# Patient Record
Sex: Male | Born: 1980 | Race: Black or African American | Hispanic: No | Marital: Single | Smoking: Never smoker
Health system: Southern US, Community
[De-identification: ages and names within clinical notes are randomized; demographics above are authoritative.]

## PROBLEM LIST (undated history)

## (undated) DIAGNOSIS — Z8241 Family history of sudden cardiac death: Secondary | ICD-10-CM

## (undated) DIAGNOSIS — F988 Other specified behavioral and emotional disorders with onset usually occurring in childhood and adolescence: Secondary | ICD-10-CM

## (undated) DIAGNOSIS — R002 Palpitations: Secondary | ICD-10-CM

## (undated) HISTORY — DX: Palpitations: R00.2

## (undated) HISTORY — DX: Family history of sudden cardiac death: Z82.41

## (undated) HISTORY — DX: Other specified behavioral and emotional disorders with onset usually occurring in childhood and adolescence: F98.8

---

## 2000-07-26 ENCOUNTER — Emergency Department (HOSPITAL_COMMUNITY): Admission: EM | Admit: 2000-07-26 | Discharge: 2000-07-26 | Payer: Self-pay | Admitting: Emergency Medicine

## 2002-06-10 ENCOUNTER — Emergency Department (HOSPITAL_COMMUNITY): Admission: EM | Admit: 2002-06-10 | Discharge: 2002-06-10 | Payer: Self-pay | Admitting: *Deleted

## 2002-06-10 ENCOUNTER — Encounter: Payer: Self-pay | Admitting: *Deleted

## 2004-12-21 ENCOUNTER — Ambulatory Visit: Payer: Self-pay | Admitting: Family Medicine

## 2005-02-06 ENCOUNTER — Ambulatory Visit: Payer: Self-pay | Admitting: Family Medicine

## 2005-12-13 ENCOUNTER — Ambulatory Visit: Payer: Self-pay | Admitting: Family Medicine

## 2006-04-11 ENCOUNTER — Ambulatory Visit: Payer: Self-pay | Admitting: Family Medicine

## 2006-06-05 ENCOUNTER — Emergency Department (HOSPITAL_COMMUNITY): Admission: EM | Admit: 2006-06-05 | Discharge: 2006-06-05 | Payer: Self-pay | Admitting: Emergency Medicine

## 2007-05-26 DIAGNOSIS — L708 Other acne: Secondary | ICD-10-CM

## 2007-05-26 DIAGNOSIS — T7840XA Allergy, unspecified, initial encounter: Secondary | ICD-10-CM | POA: Insufficient documentation

## 2007-05-27 ENCOUNTER — Ambulatory Visit: Payer: Self-pay | Admitting: Family Medicine

## 2007-05-27 DIAGNOSIS — A630 Anogenital (venereal) warts: Secondary | ICD-10-CM | POA: Insufficient documentation

## 2007-06-02 ENCOUNTER — Encounter: Payer: Self-pay | Admitting: Family Medicine

## 2008-08-05 ENCOUNTER — Ambulatory Visit: Payer: Self-pay | Admitting: Family Medicine

## 2010-01-18 ENCOUNTER — Ambulatory Visit: Payer: Self-pay | Admitting: Family Medicine

## 2010-01-18 DIAGNOSIS — D179 Benign lipomatous neoplasm, unspecified: Secondary | ICD-10-CM | POA: Insufficient documentation

## 2010-07-10 NOTE — Assessment & Plan Note (Signed)
Summary: CHECK PLACE ON R ARM/CLE   Vital Signs:  Patient profile:   30 year old male Weight:      189.75 pounds Temp:     97.9 degrees F oral Pulse rate:   84 / minute Pulse rhythm:   regular BP sitting:   110 / 76  (left arm) Cuff size:   large  Vitals Entered By: Delilah Shan CMA Duncan Dull) (January 18, 2010 10:43 AM) CC: Check place on right arm.   History of Present Illness: On R arm, near mid triceps.  Noticed about 65months-1 year ago.  Single lesion.  tender to palpation.  No change in size recently.  No drainage.  No erythema.      Allergies: No Known Drug Allergies  Review of Systems       See HPI.  Otherwise negative.    Physical Exam  General:  GEN: nad, alert and oriented HEENT: mucous membranes moist NECK: supple w/o LA CV: rrr.  no murmur PULM: ctab, no inc wob SKIN: no acute rash, 6mm lesion that feels like a typical lipoma on R posterior arm.  No erythema.  No other masses, no LA in axilla.    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  D/w patient.  No intervention o/w.  he understood.   Patient Instructions: 1)  Call us back if the spot is getting a lot bigger or is more painful.  I think it is a lipoma.    Prior Medications (reviewed today): None Current Allergies (reviewed today): No known allergies

## 2011-06-06 ENCOUNTER — Encounter: Payer: Self-pay | Admitting: Family Medicine

## 2011-06-06 ENCOUNTER — Ambulatory Visit (INDEPENDENT_AMBULATORY_CARE_PROVIDER_SITE_OTHER): Payer: 59 | Admitting: Family Medicine

## 2011-06-06 VITALS — BP 120/74 | HR 100 | Temp 99.9°F | Resp 82 | Wt 188.5 lb

## 2011-06-06 DIAGNOSIS — R6889 Other general symptoms and signs: Secondary | ICD-10-CM

## 2011-06-06 MED ORDER — AZITHROMYCIN 250 MG PO TABS: ORAL_TABLET | ORAL | Status: AC

## 2011-06-06 MED ORDER — HYDROCOD POLST-CHLORPHEN POLST 10-8 MG/5ML PO LQCR: 5.0000 mL | Freq: Two times a day (BID) | ORAL | Status: DC | PRN

## 2011-06-06 NOTE — Progress Notes (Signed)
SUBJECTIVE:  Christopher West is a 30 y.o. male who complains of coryza, congestion, myalgias, headache and fever for 4 days. He denies a history of anorexia, chest pain and shortness of breath and denies a history of asthma. Patient denies smoke cigarettes. Did not get flu shot this year.  No known sick contacts.  Patient Active Problem List  Diagnoses  . VENEREAL WART  . LIPOMA  . ACNE VULGARIS  . ALLERGY   No past medical history on file. No past surgical history on file. History  Substance Use Topics  . Smoking status: Never Smoker   . Smokeless tobacco: Not on file  . Alcohol Use: Not on file   No family history on file. Allergies not on file No current outpatient prescriptions on file prior to visit.   The PMH, PSH, Social History, Family History, Medications, and allergies have been reviewed in [facility], and have been updated if relevant.    OBJECTIVE: BP 120/74  Pulse 100  Temp(Src) 99.9 F (37.7 C) (Oral)  Resp 82  Wt 188 lb 8 oz (85.503 kg)  He appears well, vital signs are as noted. Ears normal.  Throat and pharynx normal.  Neck supple. No adenopathy in the neck. Nose is congested. Sinuses non tender. Ronchi on right side, otherwise clear.   ASSESSMENT:  Influenza- ? Possible post flu PNA  PLAN: Zpack, tussionex as needed for cough. Symptomatic therapy suggested: push fluids, rest and return office visit prn if symptoms persist or worsen.  Call or return to clinic prn if these symptoms worsen or fail to improve as anticipated.

## 2011-06-06 NOTE — Patient Instructions (Signed)
Take antibiotic as directed.  Drink lots of fluids.  Treat sympotmatically with Mucinex, nasal saline irrigation, and Tylenol/Ibuprofen. Also try claritin D or zyrtec D over the counter- two times a day as needed ( have to sign for them at pharmacy). Cough suppressant at night. Call if not improving as expected in 5-7 days.

## 2012-03-09 ENCOUNTER — Ambulatory Visit (INDEPENDENT_AMBULATORY_CARE_PROVIDER_SITE_OTHER): Payer: 59 | Admitting: Family Medicine

## 2012-03-09 ENCOUNTER — Encounter: Payer: Self-pay | Admitting: Family Medicine

## 2012-03-09 VITALS — BP 120/60 | HR 76 | Temp 98.5°F | Wt 194.0 lb

## 2012-03-09 DIAGNOSIS — J329 Chronic sinusitis, unspecified: Secondary | ICD-10-CM

## 2012-03-09 MED ORDER — AMOXICILLIN-POT CLAVULANATE 875-125 MG PO TABS: 1.0000 | ORAL_TABLET | Freq: Two times a day (BID) | ORAL | Status: DC

## 2012-03-09 MED ORDER — FLUTICASONE PROPIONATE 50 MCG/ACT NA SUSP: 2.0000 | Freq: Every day | NASAL | Status: DC

## 2012-03-09 NOTE — Patient Instructions (Addendum)
Take Augmentinas directed.  Drink lots of fluids.  Treat sympotmatically with Flonase, Mucinex, nasal saline irrigation, and Tylenol/Ibuprofen. Also try claritin D or zyrtec D over the counter- two times a day as needed ( have to sign for them at pharmacy). You can use warm compresses.   Call if not improving as expected in 5-7 days.

## 2012-03-09 NOTE — Progress Notes (Signed)
SUBJECTIVE:  Christopher West is a 31 y.o. male who complains of coryza, congestion, myalgias, bilateral sinus pain and fever for 8 days. He denies a history of anorexia and chest pain and denies a history of asthma. Patient denies smoke cigarettes.   Patient Active Problem List  Diagnosis  . VENEREAL WART  . LIPOMA  . ACNE VULGARIS  . ALLERGY   No past medical history on file. No past surgical history on file. History  Substance Use Topics  . Smoking status: Never Smoker   . Smokeless tobacco: Not on file  . Alcohol Use: Not on file   No family history on file. No Known Allergies No current outpatient prescriptions on file prior to visit.   The PMH, PSH, Social History, Family History, Medications, and allergies have been reviewed in [facility], and have been updated if relevant.  OBJECTIVE: BP 120/60  Pulse 76  Temp 98.5 F (36.9 C)  Wt 194 lb (87.998 kg)  He appears well, vital signs are as noted. Ears normal.  Throat and pharynx normal.  Neck supple. No adenopathy in the neck. Nose is congested. Sinuses non tender. The chest is clear, without wheezes or rales.  ASSESSMENT:  sinusitis  PLAN: Given duration and progression of symptoms, will treat for bacterial sinusitis. Symptomatic therapy suggested: push fluids, rest and return office visit prn if symptoms persist or worsen.  Call or return to clinic prn if these symptoms worsen or fail to improve as anticipated.

## 2013-04-30 ENCOUNTER — Encounter: Payer: Self-pay | Admitting: Family Medicine

## 2013-04-30 ENCOUNTER — Ambulatory Visit (INDEPENDENT_AMBULATORY_CARE_PROVIDER_SITE_OTHER): Payer: 59 | Admitting: Family Medicine

## 2013-04-30 VITALS — BP 116/78 | HR 64 | Temp 97.9°F | Wt 194.2 lb

## 2013-04-30 DIAGNOSIS — H00019 Hordeolum externum unspecified eye, unspecified eyelid: Secondary | ICD-10-CM

## 2013-04-30 DIAGNOSIS — R2230 Localized swelling, mass and lump, unspecified upper limb: Secondary | ICD-10-CM | POA: Insufficient documentation

## 2013-04-30 DIAGNOSIS — R229 Localized swelling, mass and lump, unspecified: Secondary | ICD-10-CM

## 2013-04-30 DIAGNOSIS — H00013 Hordeolum externum right eye, unspecified eyelid: Secondary | ICD-10-CM

## 2013-04-30 DIAGNOSIS — R2231 Localized swelling, mass and lump, right upper limb: Secondary | ICD-10-CM

## 2013-04-30 MED ORDER — ERYTHROMYCIN 5 MG/GM OP OINT: 1.0000 "application " | TOPICAL_OINTMENT | Freq: Three times a day (TID) | OPHTHALMIC | Status: DC

## 2013-04-30 NOTE — Assessment & Plan Note (Signed)
Discussed - supportive care as per instructions. Handout provided. If not improving, will refer to ophtho - may need I&D

## 2013-04-30 NOTE — Assessment & Plan Note (Signed)
Anticipate epidermal cyst - as not currently infected will just monitor.

## 2013-04-30 NOTE — Addendum Note (Signed)
Addended by: Eustaquio Boyden on: 04/30/2013 08:52 AM   Modules accepted: Orders

## 2013-04-30 NOTE — Progress Notes (Signed)
Pre-visit discussion using our clinic review tool. No additional management support is needed unless otherwise documented below in the visit note.  

## 2013-04-30 NOTE — Progress Notes (Signed)
  Subjective:    Patient ID: Christopher West, male    DOB: [birth year], 32 y.o.   MRN: [id number]  HPI CC: check lump on eye and on hand  Stye on right eye - for last 2.5 weeks.   Has been using warm compresses, eyedrops for stye from [facility].  Also wants right arm lump evaluated - tender to palpation.  Present for last 3-4 years.  Inferior right arm.  Occasionally sore, never drained or red.  No past medical history on file.   Review of Systems Per HPI    Objective:   Physical Exam  Nursing note and vitals reviewed. Constitutional: He appears well-developed and well-nourished. No distress.  Eyes: Conjunctivae and EOM are normal. Pupils are equal, round, and reactive to light. Right eye exhibits hordeolum. Right eye exhibits no discharge. No foreign body present in the right eye. Left eye exhibits no discharge and no hordeolum. No foreign body present in the left eye. No scleral icterus.    Indurated swelling R medial upper eyelid with erythema at palpebral conjunctiva  Musculoskeletal:  Subcutaneous nodule inferior R arm distal to axilla, no fluctuance or erythema. Slightly tender to deep palpation.  Skin: Skin is warm and dry. No rash noted.       Assessment & Plan:

## 2013-04-30 NOTE — Patient Instructions (Signed)
I think you have a cyst under right arm - would just continue to watch, let us know if enlarging or becoming warm or red - this would be concern for infection I think you have stye of R eye - treat with continued warm compresses, massaging, and erythromycin ointment to eye.  If not improving in next 1 week, let us know for referral to eye doctor.

## 2013-05-25 ENCOUNTER — Telehealth: Payer: Self-pay | Admitting: Family Medicine

## 2013-05-25 DIAGNOSIS — H00019 Hordeolum externum unspecified eye, unspecified eyelid: Secondary | ICD-10-CM

## 2013-05-25 NOTE — Telephone Encounter (Signed)
Referral placed.

## 2013-05-25 NOTE — Telephone Encounter (Signed)
Pt was in to see you on 04/30/2013 w/a problem eye.  He says you told him if it was no better w/in a week to call you and get a referral to an eye dr.  [name] says it's no better so he needs the referral. Can you place the referral? Thank you.

## 2014-11-28 ENCOUNTER — Encounter: Payer: 59 | Admitting: Family Medicine

## 2014-12-04 ENCOUNTER — Emergency Department: Payer: 59

## 2014-12-04 ENCOUNTER — Encounter: Payer: Self-pay | Admitting: Emergency Medicine

## 2014-12-04 ENCOUNTER — Observation Stay
Admission: EM | Admit: 2014-12-04 | Discharge: 2014-12-05 | Disposition: A | Discharge status: Home / Self Care | Payer: 59 | Attending: Internal Medicine | Admitting: Internal Medicine

## 2014-12-04 DIAGNOSIS — R55 Syncope and collapse: Secondary | ICD-10-CM | POA: Diagnosis not present

## 2014-12-04 DIAGNOSIS — Z79899 Other long term (current) drug therapy: Secondary | ICD-10-CM | POA: Diagnosis not present

## 2014-12-04 DIAGNOSIS — R000 Tachycardia, unspecified: Secondary | ICD-10-CM | POA: Insufficient documentation

## 2014-12-04 DIAGNOSIS — Z888 Allergy status to other drugs, medicaments and biological substances status: Secondary | ICD-10-CM | POA: Diagnosis not present

## 2014-12-04 DIAGNOSIS — E876 Hypokalemia: Secondary | ICD-10-CM | POA: Insufficient documentation

## 2014-12-04 DIAGNOSIS — R0789 Other chest pain: Principal | ICD-10-CM | POA: Insufficient documentation

## 2014-12-04 DIAGNOSIS — F17200 Nicotine dependence, unspecified, uncomplicated: Secondary | ICD-10-CM | POA: Diagnosis not present

## 2014-12-04 DIAGNOSIS — A630 Anogenital (venereal) warts: Secondary | ICD-10-CM | POA: Insufficient documentation

## 2014-12-04 DIAGNOSIS — N62 Hypertrophy of breast: Secondary | ICD-10-CM | POA: Insufficient documentation

## 2014-12-04 DIAGNOSIS — Z8241 Family history of sudden cardiac death: Secondary | ICD-10-CM | POA: Diagnosis not present

## 2014-12-04 DIAGNOSIS — R079 Chest pain, unspecified: Secondary | ICD-10-CM | POA: Diagnosis present

## 2014-12-04 LAB — TROPONIN I
TROPONIN I: 0.04 ng/mL — AB (ref <0.031)
Troponin I: 0.04 ng/mL — ABNORMAL HIGH (ref <0.031)
Troponin I: 0.04 ng/mL — ABNORMAL HIGH (ref <0.031)

## 2014-12-04 LAB — CBC WITH DIFFERENTIAL/PLATELET
Basophils Absolute: 0 10*3/uL (ref 0–0.1)
Basophils Relative: 0 %
Eosinophils Absolute: 0 10*3/uL (ref 0–0.7)
Eosinophils Relative: 0 %
HCT: 42.3 % (ref 40.0–52.0)
Hemoglobin: 13.8 g/dL (ref 13.0–18.0)
LYMPHS ABS: 0.5 10*3/uL — AB (ref 1.0–3.6)
LYMPHS PCT: 5 %
MCH: 28.6 pg (ref 26.0–34.0)
MCHC: 32.7 g/dL (ref 32.0–36.0)
MCV: 87.6 fL (ref 80.0–100.0)
MONOS PCT: 4 %
Monocytes Absolute: 0.4 10*3/uL (ref 0.2–1.0)
Neutro Abs: 9.8 10*3/uL — ABNORMAL HIGH (ref 1.4–6.5)
Neutrophils Relative %: 91 %
Platelets: 306 10*3/uL (ref 150–440)
RBC: 4.83 MIL/uL (ref 4.40–5.90)
RDW: 13.2 % (ref 11.5–14.5)
WBC: 10.8 10*3/uL — ABNORMAL HIGH (ref 3.8–10.6)

## 2014-12-04 LAB — BASIC METABOLIC PANEL
ANION GAP: 13 (ref 5–15)
BUN: 13 mg/dL (ref 6–20)
CALCIUM: 9.3 mg/dL (ref 8.9–10.3)
CO2: 25 mmol/L (ref 22–32)
Chloride: 100 mmol/L — ABNORMAL LOW (ref 101–111)
Creatinine, Ser: 0.88 mg/dL (ref 0.61–1.24)
GFR calc Af Amer: >60 mL/min (ref >60)
GLUCOSE: 129 mg/dL — AB (ref 65–99)
POTASSIUM: 3 mmol/L — AB (ref 3.5–5.1)
Sodium: 138 mmol/L (ref 135–145)

## 2014-12-04 LAB — MAGNESIUM: Magnesium: 2 mg/dL (ref 1.7–2.4)

## 2014-12-04 MED ORDER — ASPIRIN 81 MG PO CHEW
324.0000 mg | CHEWABLE_TABLET | Freq: Once | ORAL | Status: AC
  Administered 2014-12-04: 324 mg via ORAL

## 2014-12-04 MED ORDER — POTASSIUM CHLORIDE 20 MEQ PO PACK
20.0000 meq | PACK | Freq: Two times a day (BID) | ORAL | Status: DC
  Administered 2014-12-04 – 2014-12-05 (×3): 20 meq via ORAL
  Filled 2014-12-04 (×3): qty 1

## 2014-12-04 MED ORDER — DIPHENHYDRAMINE HCL 25 MG PO CAPS
25.0000 mg | ORAL_CAPSULE | Freq: Every evening | ORAL | Status: DC | PRN
  Administered 2014-12-04: 25 mg via ORAL
  Filled 2014-12-04: qty 1

## 2014-12-04 MED ORDER — ACETAMINOPHEN 325 MG PO TABS: 650.0000 mg | ORAL_TABLET | Freq: Four times a day (QID) | ORAL | Status: DC | PRN

## 2014-12-04 MED ORDER — SODIUM CHLORIDE 0.9 % IJ SOLN
3.0000 mL | Freq: Two times a day (BID) | INTRAMUSCULAR | Status: DC
  Administered 2014-12-05: 3 mL via INTRAVENOUS

## 2014-12-04 MED ORDER — LORAZEPAM 2 MG/ML IJ SOLN
INTRAMUSCULAR | Status: AC
  Administered 2014-12-04: 1 mg via INTRAVENOUS
  Filled 2014-12-04: qty 1

## 2014-12-04 MED ORDER — ASPIRIN 81 MG PO CHEW
81.0000 mg | CHEWABLE_TABLET | Freq: Every day | ORAL | Status: DC
  Administered 2014-12-05: 81 mg via ORAL
  Filled 2014-12-04: qty 1

## 2014-12-04 MED ORDER — SODIUM CHLORIDE 0.9 % IV SOLN: 250.0000 mL | INTRAVENOUS | Status: DC | PRN

## 2014-12-04 MED ORDER — ASPIRIN 81 MG PO CHEW
CHEWABLE_TABLET | ORAL | Status: AC
Start: 2014-12-04 — End: 2014-12-05
  Filled 2014-12-04: qty 4

## 2014-12-04 MED ORDER — LORAZEPAM 2 MG/ML IJ SOLN
1.0000 mg | Freq: Once | INTRAMUSCULAR | Status: AC
  Administered 2014-12-04: 1 mg via INTRAVENOUS

## 2014-12-04 MED ORDER — SODIUM CHLORIDE 0.9 % IJ SOLN: 3.0000 mL | INTRAMUSCULAR | Status: DC | PRN

## 2014-12-04 MED ORDER — METOCLOPRAMIDE HCL 5 MG/ML IJ SOLN
10.0000 mg | Freq: Once | INTRAMUSCULAR | Status: AC
  Administered 2014-12-04: 10 mg via INTRAVENOUS

## 2014-12-04 MED ORDER — SODIUM CHLORIDE 0.9 % IV BOLUS (SEPSIS)
1000.0000 mL | Freq: Once | INTRAVENOUS | Status: AC
  Administered 2014-12-04: 1000 mL via INTRAVENOUS

## 2014-12-04 MED ORDER — IOHEXOL 350 MG/ML SOLN
100.0000 mL | Freq: Once | INTRAVENOUS | Status: AC | PRN
  Administered 2014-12-04: 100 mL via INTRAVENOUS

## 2014-12-04 MED ORDER — SODIUM CHLORIDE 0.9 % IJ SOLN
3.0000 mL | Freq: Two times a day (BID) | INTRAMUSCULAR | Status: DC
  Administered 2014-12-04 – 2014-12-05 (×3): 3 mL via INTRAVENOUS

## 2014-12-04 MED ORDER — METOCLOPRAMIDE HCL 5 MG/ML IJ SOLN
INTRAMUSCULAR | Status: AC
  Administered 2014-12-04: 10 mg via INTRAVENOUS
  Filled 2014-12-04: qty 2

## 2014-12-04 MED ORDER — ACETAMINOPHEN 650 MG RE SUPP: 650.0000 mg | Freq: Four times a day (QID) | RECTAL | Status: DC | PRN

## 2014-12-04 MED ORDER — ALUM & MAG HYDROXIDE-SIMETH 200-200-20 MG/5ML PO SUSP
15.0000 mL | ORAL | Status: DC | PRN
  Administered 2014-12-04: 15 mL via ORAL
  Filled 2014-12-04: qty 30
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
## 2014-12-04 NOTE — Progress Notes (Signed)
Pt requesting melatonin for sleep.  Informed by pharmacy we are no longer allowed to order or administer herbal supplements

## 2014-12-04 NOTE — ED Notes (Signed)
Patient transported to CT 
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
## 2014-12-04 NOTE — Progress Notes (Signed)
Pt in NAD, skin warm and dry, respirations even and unlabored.  VSS, SR per monitor.  Pt denies pain or discomfort at this time.  Mylanta ordered for "heartburn" and benadryl for sleep at pt request since admission.  Wife at the bedside.  Continued monitoring.  Troponins 0.04 x 2, Mg 2.
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
## 2014-12-04 NOTE — ED Notes (Signed)
MD at bedside. 

## 2014-12-05 LAB — BASIC METABOLIC PANEL
Anion gap: 9 (ref 5–15)
BUN: 6 mg/dL (ref 6–20)
CALCIUM: 9 mg/dL (ref 8.9–10.3)
CHLORIDE: 107 mmol/L (ref 101–111)
CO2: 23 mmol/L (ref 22–32)
Creatinine, Ser: 0.87 mg/dL (ref 0.61–1.24)
GFR calc Af Amer: >60 mL/min (ref >60)
GFR calc non Af Amer: >60 mL/min (ref >60)
Glucose, Bld: 123 mg/dL — ABNORMAL HIGH (ref 65–99)
Potassium: 3.4 mmol/L — ABNORMAL LOW (ref 3.5–5.1)
Sodium: 139 mmol/L (ref 135–145)

## 2014-12-05 LAB — TROPONIN I: Troponin I: <0.03 ng/mL (ref <0.031)

## 2014-12-05 NOTE — Progress Notes (Signed)
Christopher West was admitted to the Hospital on 12/04/2014 and Discharged  12/05/2014 and should be excused from work/school   for 12/04/2014 and 12/05/2014 ,he may return to work/school without any restrictions.  Call Bettey Costa MD with questions.  Nemesis Rainwater M.D on 12/05/2014,at 9:09 AM  [place] [facility]  [phone]

## 2014-12-05 NOTE — Progress Notes (Signed)
No chest pain. A & O. Takes meds ok. Ambulating in the hallway. Wife at the bedside. Room air. NSR. IV and tele removed. Discharge instructions given to pt. Pt has no further concerns at this time.

## 2014-12-05 NOTE — Plan of Care (Signed)
Problem: Discharge Progression Outcomes Goal: Other Discharge Outcomes/Goals Outcome: Progressing Pt has denied pain this shift. Ambulated with spouse at side in the hallway without difficulty. Benadryl given for sleep with improvement. Pt give emotional support because he stated " I'm afraid to go to sleep" Reassured patient that he is constantly being monitored. Pt c/o his dislike of the position of his iv, so iv changed per patient request.

## 2014-12-05 NOTE — Discharge Summary (Signed)
[facility] [place] NAME: Christopher West    MR#:  [id number]  DATE OF BIRTH:  [birth year]  DATE OF ADMISSION:  12/04/2014 ADMITTING PHYSICIAN: Baxter Hire, MD  DATE OF DISCHARGE: 12/05/2014 10:20 AM  PRIMARY CARE PHYSICIAN: Loura Pardon, MD    ADMISSION DIAGNOSIS:  Chest pain, unspecified chest pain type [R07.9]  DISCHARGE DIAGNOSIS:  Active Problems:   Chest pain   SECONDARY DIAGNOSIS:  History reviewed. No pertinent past medical history.  HOSPITAL COURSE:  This is a very pleasant 34 year old male with no past medical history who presented with atypical chest pain. For further details please further H&P.  1. 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 His troponins were negative 3 And telemetry monitoring was also essentially normal. Patient had no chest pain while in the hospital. Patient may resume his normal activities.   DISCHARGE CONDITIONS AND DIET:  Patient is to be discharged home in stable condition on a regular diet  CONSULTS OBTAINED:    none  DRUG ALLERGIES:   Allergies  Allergen Reactions  . Ativan [Lorazepam] Anxiety    DISCHARGE MEDICATIONS:   Discharge Medication List as of 12/05/2014 10:03 AM    CONTINUE these medications which have NOT CHANGED   Details  fluticasone (FLONASE) 50 MCG/ACT nasal spray Place 2 sprays into the nose daily., Starting 03/09/2012, Until Discontinued, Normal              Today   CHIEF COMPLAINT:  Patient has no complaints this morning. Patient denies chest pressure or shortness of breath.   VITAL SIGNS:  Blood pressure 128/60, pulse 84, temperature 98.9 F (37.2 C), temperature source Oral, resp. rate 18, height 5\' 7"  (1.702 m), weight 86.546 kg (190 lb 12.8 oz), SpO2 99 %.   REVIEW OF SYSTEMS:  Review of Systems  Constitutional: Negative for fever, chills and malaise/fatigue.  HENT: Negative for  sore throat.   Eyes: Negative for blurred vision.  Respiratory: Negative for cough, hemoptysis, shortness of breath and wheezing.   Cardiovascular: Negative for chest pain, palpitations and leg swelling.  Gastrointestinal: Negative for nausea, vomiting, abdominal pain, diarrhea and blood in stool.  Genitourinary: Negative for dysuria.  Musculoskeletal: Negative for back pain.  Neurological: Negative for dizziness, tremors and headaches.  Endo/Heme/Allergies: Does not bruise/bleed easily.     PHYSICAL EXAMINATION:  GENERAL:  34 y.o.-year-old patient lying in the bed with no acute distress.  NECK:  Supple, no jugular venous distention. No thyroid enlargement, no tenderness.  LUNGS: Normal breath sounds bilaterally, no wheezing, rales,rhonchi  No use of accessory muscles of respiration.  CARDIOVASCULAR: S1, S2 normal. No murmurs, rubs, or gallops.  ABDOMEN: Soft, non-tender, non-distended. Bowel sounds present. No organomegaly or mass.  EXTREMITIES: No pedal edema, cyanosis, or clubbing.  PSYCHIATRIC: The patient is alert and oriented x 3.  SKIN: No obvious rash, lesion, or ulcer.   DATA REVIEW:   CBC  Recent Labs Lab 12/04/14 0956  WBC 10.8*  HGB 13.8  HCT 42.3  PLT 306    Chemistries   Recent Labs Lab 12/04/14 1432 12/05/14 0106  NA  --  139  K  --  3.4*  CL  --  107  CO2  --  23  GLUCOSE  --  123*  BUN  --  6  CREATININE  --  0.87  CALCIUM  --  9.0  MG 2.0  --  Cardiac Enzymes  Recent Labs Lab 12/04/14 1432 12/04/14 1958 12/05/14 0106  TROPONINI 0.04* 0.04* <0.03    Microbiology Results  @[email or web address] @  RADIOLOGY:  Dg Chest 2 View  12/04/2014   CLINICAL DATA:  Chest pain, tachycardia, and near-syncope.  EXAM: CHEST  2 VIEW  COMPARISON:  None.  FINDINGS: The cardiomediastinal silhouette is within normal limits. The lungs are normally to slightly hypoinflated with minimal opacities in the lung bases. No segmental airspace consolidation, edema,  pleural effusion, or pneumothorax is identified. No acute osseous abnormality is seen.  IMPRESSION: Trace bibasilar opacities, likely atelectasis or scarring. No evidence of pneumonia.   Electronically Signed   By: Logan Bores   On: 12/04/2014 10:50   Ct Angio Chest Pe W/cm &/or Wo Cm  12/04/2014   CLINICAL DATA:  Tachycardia.  Chest pain.  Near syncopal episode.  EXAM: CT ANGIOGRAPHY CHEST WITH CONTRAST  TECHNIQUE: Multidetector CT imaging of the chest was performed using the standard protocol during bolus administration of intravenous contrast. Multiplanar CT image reconstructions and MIPs were obtained to evaluate the vascular anatomy.  CONTRAST:  126mL OMNIPAQUE IOHEXOL 350 MG/ML SOLN  COMPARISON:  Chest radiograph - earlier same day  FINDINGS: Vascular Findings:  There is adequate opacification of the pulmonary arterial system with the main pulmonary artery measuring 241 Hounsfield units. There are no discrete filling defects within the pulmonary arterial tree to the level of the bilateral subsegmental pulmonary arteries. Evaluation of the distal subsegmental pulmonary arteries is degraded secondary to a combination of suboptimal vessel opacification as well as cardiac pulsation and respiratory artifact. Normal caliber of the main pulmonary artery.  Normal heart size.  No pericardial effusion.  Normal caliber the thoracic aorta. No definite thoracic aortic dissection on this nongated examination. Conventional configuration of the aortic arch. The branch vessels of the aortic arch appear widely patent throughout their imaged course.  Review of the MIP images confirms the above findings.   ----------------------------------------------------------------------------------  Nonvascular Findings:  No focal airspace opacities. No pleural effusion pneumothorax. The central pulmonary airways appear widely patent.  No discrete pulmonary nodules. No mediastinal, hilar or axillary lymphadenopathy. There is a minimal  amount of ill-defined soft tissue within the anterior mediastinum which is favored to represent residual thymic tissue.  Limited visualization of the superior aspect of the abdomen demonstrates a small splenule.  No acute or aggressive osseous abnormalities.  Incidental note is made of mild bilateral gynecomastia. Normal appearance of the thyroid gland.  IMPRESSION: No acute cardiopulmonary disease. Specifically, no evidence of pulmonary embolism to the level of the bilateral subsegmental pulmonary arteries.   Electronically Signed   By: Sandi Mariscal M.D.   On: 12/04/2014 11:36      Management plans discussed with the patient and he is in agreement. Stable for discharge home  Patient should follow up with PCP in 1 week  CODE STATUS:     Code Status Orders        Start     Ordered   12/04/14 1313  Full code   Continuous     12/04/14 1312      TOTAL TIME TAKING CARE OF THIS PATIENT: 35 minutes.    Delbert Darley M.D on 12/05/2014 at 10:51 AM  Between 7am to 6pm - Pager - [phone] After 6pm go to [email or web address] - password [email or web address] [facility]  [facility]  Office  [phone]  CC: Primary care physician; Loura Pardon, MD

## 2014-12-07 ENCOUNTER — Ambulatory Visit (INDEPENDENT_AMBULATORY_CARE_PROVIDER_SITE_OTHER): Payer: 59 | Admitting: Family Medicine

## 2014-12-07 ENCOUNTER — Encounter: Payer: Self-pay | Admitting: Family Medicine

## 2014-12-07 ENCOUNTER — Telehealth: Payer: Self-pay | Admitting: Family Medicine

## 2014-12-07 VITALS — BP 124/76 | HR 61 | Temp 98.2°F | Ht 66.5 in | Wt 190.5 lb

## 2014-12-07 DIAGNOSIS — Z0000 Encounter for general adult medical examination without abnormal findings: Secondary | ICD-10-CM

## 2014-12-07 DIAGNOSIS — Z23 Encounter for immunization: Secondary | ICD-10-CM

## 2014-12-07 NOTE — Telephone Encounter (Signed)
Pt advise we will review FMLA forms at appt today

## 2014-12-07 NOTE — Telephone Encounter (Signed)
We will review and fill it out at his visit

## 2014-12-07 NOTE — Progress Notes (Signed)
Pre visit review using our clinic review tool, if applicable. No additional management support is needed unless otherwise documented below in the visit note. 

## 2014-12-07 NOTE — Assessment & Plan Note (Signed)
Reviewed health habits including diet and exercise and skin cancer prevention Reviewed appropriate screening tests for age  Also reviewed health mt list, fam hx and immunization status , as well as social and family history   Also rev recent hosp for cp -better now , rev recent lab Will check lipid/tsh/cbc today  Tdap today

## 2014-12-07 NOTE — Progress Notes (Signed)
Subjective:    Patient ID: Christopher West, male    DOB: 1981-04-26, 34 y.o.   MRN: [id number]  HPI Here for health maintenance exam and to review chronic medical problems     Also hosp from 6/26-6/27  Had CP and palpitations  Thought it was from energy drinks  Also some recreational drugs  Took "Cloyde Reams" as well - after consuming alcohol , and took something for ED   Labs and EKG were ok  Needs FMLA paperwork done for that   Wt is stable  Trying to loose some weight  Drinks a lot of water  Making better eating habits  Goes to the gym - runs 3 mi on the treadmill and lifts weight   Not interested in HIV screening   Td 1/00 - is overdue - will get that today  Does not take flu shots     Chemistry      Component Value Date/Time   NA 139 12/05/2014 0106   K 3.4* 12/05/2014 0106   CL 107 12/05/2014 0106   CO2 23 12/05/2014 0106   BUN 6 12/05/2014 0106   CREATININE 0.87 12/05/2014 0106      Component Value Date/Time   CALCIUM 9.0 12/05/2014 0106      Lab Results  Component Value Date   WBC 10.8* 12/04/2014   HGB 13.8 12/04/2014   HCT 42.3 12/04/2014   MCV 87.6 12/04/2014   PLT 306 12/04/2014    Has had 2 episodes of erectile problems  2nd time was likely due to fatigue   No nocturia  No urinary hesitancy   Uncle died of bladder cancer  1/2 brother died MI - young but was using cocaine  No prostate cancer  No colon cancer   Patient Active Problem List   Diagnosis Date Noted  . Routine general medical examination at a health care facility 12/07/2014  . Chest pain 12/04/2014  . Hordeolum 04/30/2013  . Skin lump of arm 04/30/2013  . LIPOMA 01/18/2010  . VENEREAL WART 05/27/2007  . 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 ALLERGY 05/26/2007   No past medical history on file. No past surgical history on file. History  Substance Use Topics  . Smoking status: Never Smoker   . Smokeless tobacco: Not on file  . Alcohol Use: 0.0 oz/week    0 Standard drinks or  equivalent per week     Comment: Rare   No family history on file. Allergies  Allergen Reactions  . Ativan [Lorazepam] Anxiety   No current outpatient prescriptions on file prior to visit.   No current facility-administered medications on file prior to visit.       Review of Systems Review of Systems  Constitutional: Negative for fever, appetite change, and unexpected weight change. pos for fatigue  Eyes: Negative for pain and visual disturbance.  Respiratory: Negative for cough and shortness of breath.   Cardiovascular: Negative for cp or palpitations    Gastrointestinal: Negative for nausea, diarrhea and constipation.  Genitourinary: Negative for urgency and frequency.  Skin: Negative for pallor or rash   Neurological: Negative for weakness, light-headedness, numbness and headaches.  Hematological: Negative for adenopathy. Does not bruise/bleed easily.  Psychiatric/Behavioral: Negative for dysphoric mood. The patient is not nervous/anxious.         Objective:   Physical Exam  Constitutional: He appears well-developed and well-nourished. No distress.  obese and well appearing   HENT:  Head: Normocephalic and atraumatic.  Right Ear: External ear normal.  Left Ear: External ear normal.  Nose: Nose normal.  Mouth/Throat: Oropharynx is clear and moist.  Eyes: Conjunctivae and EOM are normal. Pupils are equal, round, and reactive to light. Right eye exhibits no discharge. Left eye exhibits no discharge. No scleral icterus.  Neck: Normal range of motion. Neck supple. No JVD present. Carotid bruit is not present. No thyromegaly present.  Cardiovascular: Normal rate, regular rhythm, normal heart sounds and intact distal pulses.  Exam reveals no gallop.   Pulmonary/Chest: Effort normal and breath sounds normal. No respiratory distress. He has no wheezes. He exhibits no tenderness.  Abdominal: Soft. Bowel sounds are normal. He exhibits no distension, no abdominal bruit and no mass.  There is no tenderness.  Musculoskeletal: He exhibits no edema or tenderness.  Lymphadenopathy:    He has no cervical adenopathy.  Neurological: He is alert. He has normal reflexes. No cranial nerve deficit. He exhibits normal muscle tone. Coordination normal.  Skin: Skin is warm and dry. No rash noted. No erythema. No pallor.  Psychiatric: He has a normal mood and affect.          Assessment & Plan:   Problem List Items Addressed This Visit    Routine general medical examination at a health care facility - Primary    Reviewed health habits including diet and exercise and skin cancer prevention Reviewed appropriate screening tests for age  Also reviewed health mt list, fam hx and immunization status , as well as social and family history   Also rev recent hosp for cp -better now , rev recent lab Will check lipid/tsh/cbc today  Tdap today        Relevant Orders   Lipid panel (Completed)   TSH (Completed)   CBC with Differential/Platelet (Completed)    Other Visit Diagnoses    Need for Tdap vaccination        Relevant Orders    Tdap vaccine greater than or equal to 7yo IM (Completed)    Lipid panel (Completed)    TSH (Completed)    CBC with Differential/Platelet (Completed)

## 2014-12-07 NOTE — Telephone Encounter (Signed)
[facility] fmla paperwork In Dr tower's IN BOX  Pt has appointment today @ 2:30 Pt stated he went to er on 6/26  Needed fmla for 6/26-6/27-6/28

## 2014-12-07 NOTE — Patient Instructions (Signed)
Tdap vaccine today  Labs today  Drink lots of water  Get some rest  Keep up the healthy diet and exercise

## 2014-12-07 NOTE — Telephone Encounter (Signed)
Dr tower did fmla paperwork at visit  Copy for patient Copy for scan Copy for chart

## 2014-12-08 ENCOUNTER — Encounter: Payer: Self-pay | Admitting: *Deleted

## 2014-12-08 LAB — LIPID PANEL
Chol/HDL Ratio: 3 ratio units (ref 0.0–5.0)
Cholesterol, Total: 140 mg/dL (ref 100–199)
HDL: 46 mg/dL (ref >39)
LDL Calculated: 77 mg/dL (ref 0–99)
TRIGLYCERIDES: 84 mg/dL (ref 0–149)
VLDL CHOLESTEROL CAL: 17 mg/dL (ref 5–40)

## 2014-12-08 LAB — TSH: TSH: 1.14 u[IU]/mL (ref 0.450–4.500)

## 2014-12-08 LAB — CBC WITH DIFFERENTIAL/PLATELET
Basophils Absolute: 0 10*3/uL (ref 0.0–0.2)
Basos: 1 %
EOS (ABSOLUTE): 0.1 10*3/uL (ref 0.0–0.4)
EOS: 1 %
Hematocrit: 41.6 % (ref 37.5–51.0)
Hemoglobin: 13 g/dL (ref 12.6–17.7)
Immature Grans (Abs): 0 10*3/uL (ref 0.0–0.1)
Immature Granulocytes: 0 %
LYMPHS ABS: 1.7 10*3/uL (ref 0.7–3.1)
Lymphs: 34 %
MCH: 28 pg (ref 26.6–33.0)
MCHC: 31.3 g/dL — ABNORMAL LOW (ref 31.5–35.7)
MCV: 90 fL (ref 79–97)
Monocytes Absolute: 0.5 10*3/uL (ref 0.1–0.9)
Monocytes: 9 %
NEUTROS ABS: 2.7 10*3/uL (ref 1.4–7.0)
Neutrophils: 55 %
PLATELETS: 357 10*3/uL (ref 150–379)
RBC: 4.65 x10E6/uL (ref 4.14–5.80)
RDW: 13.3 % (ref 12.3–15.4)
WBC: 5 10*3/uL (ref 3.4–10.8)

## 2015-02-02 ENCOUNTER — Ambulatory Visit (INDEPENDENT_AMBULATORY_CARE_PROVIDER_SITE_OTHER): Payer: 59 | Admitting: Family Medicine

## 2015-02-02 ENCOUNTER — Encounter: Payer: Self-pay | Admitting: Family Medicine

## 2015-02-02 VITALS — BP 96/60 | HR 67 | Temp 98.7°F | Ht 66.5 in | Wt 185.0 lb

## 2015-02-02 DIAGNOSIS — J029 Acute pharyngitis, unspecified: Secondary | ICD-10-CM | POA: Insufficient documentation

## 2015-02-02 LAB — POCT RAPID STREP A (OFFICE): Rapid Strep A Screen: NEGATIVE

## 2015-02-02 NOTE — Progress Notes (Signed)
   Subjective:    Patient ID: Christopher West, male    DOB: [birth year], 34 y.o.   MRN: [id number]  Sore Throat  This is a new problem. The current episode started in the past 7 days. The problem has been gradually improving. The pain is worse on the right side. There has been no fever. The pain is moderate. Associated symptoms include coughing and trouble swallowing. Pertinent negatives include no congestion, ear discharge, ear pain, headaches, plugged ear sensation or shortness of breath. Associated symptoms comments: fatigue weakness, slightly dizziness 2 days ago, better now. He has had no exposure to strep or mono. Exposure to: sick contact with ear infection. Treatments tried: throat sray, lozenges. The treatment provided mild relief.  Fever  Associated symptoms include coughing. Pertinent negatives include no congestion, ear pain or headaches.      Review of Systems  Constitutional: Positive for fever.  HENT: Positive for trouble swallowing. Negative for congestion, ear discharge and ear pain.   Respiratory: Positive for cough. Negative for shortness of breath.   Neurological: Negative for headaches.       Objective:   Physical Exam  Constitutional: Vital signs are normal. He appears well-developed and well-nourished.  Non-toxic appearance. He does not appear ill. No distress.  HENT:  Head: Normocephalic and atraumatic.  Right Ear: Hearing, tympanic membrane, external ear and ear canal normal. No tenderness. No foreign bodies. Tympanic membrane is not retracted and not bulging.  Left Ear: Hearing, tympanic membrane, external ear and ear canal normal. No tenderness. No foreign bodies. Tympanic membrane is not retracted and not bulging.  Nose: Nose normal. No mucosal edema or rhinorrhea. Right sinus exhibits no maxillary sinus tenderness and no frontal sinus tenderness. Left sinus exhibits no maxillary sinus tenderness and no frontal sinus tenderness.  Mouth/Throat: Uvula is  midline and mucous membranes are normal. Normal dentition. No dental caries. Oropharyngeal exudate, posterior oropharyngeal edema and posterior oropharyngeal erythema present. No tonsillar abscesses.    On right  Eyes: Conjunctivae, EOM and lids are normal. Pupils are equal, round, and reactive to light. Lids are everted and swept, no foreign bodies found.  Neck: Trachea normal, normal range of motion and phonation normal. Neck supple. Carotid bruit is not present. No thyroid mass and no thyromegaly present.  Cardiovascular: Normal rate, regular rhythm, S1 normal, S2 normal, normal heart sounds, intact distal pulses and normal pulses.  Exam reveals no gallop.   No murmur heard. Pulmonary/Chest: Effort normal and breath sounds normal. No respiratory distress. He has no wheezes. He has no rhonchi. He has no rales.  Abdominal: Soft. Normal appearance and bowel sounds are normal. There is no hepatosplenomegaly. There is no tenderness. There is no rebound, no guarding and no CVA tenderness. No hernia.  Neurological: He is alert. He has normal reflexes.  Skin: Skin is warm, dry and intact. No rash noted.  Psychiatric: He has a normal mood and affect. His speech is normal and behavior is normal. Judgment normal.          Assessment & Plan:

## 2015-02-02 NOTE — Progress Notes (Signed)
Pre visit review using our clinic review tool, if applicable. No additional management support is needed unless otherwise documented below in the visit note. 

## 2015-02-02 NOTE — Assessment & Plan Note (Signed)
Neg rapid strep. Will send culture given? Food vs exudate on right tonsil.  Symptomatic care until culture returns.

## 2015-02-02 NOTE — Patient Instructions (Signed)
Can gargle water to clear throat.  Can use ibuprofen 800 mg three times a day.  We will call you with culture results.  Okay to return to work as no fever.

## 2015-02-02 NOTE — Telephone Encounter (Signed)
Copy for patient Pt aware fmla paperwork faxed to [phone]

## 2015-02-02 NOTE — Addendum Note (Signed)
Addended by: Carter Kitten on: 02/02/2015 03:22 PM   Modules accepted: Orders

## 2015-02-05 LAB — CULTURE, GROUP A STREP: STREP A CULTURE: NEGATIVE

## 2015-09-08 ENCOUNTER — Ambulatory Visit (INDEPENDENT_AMBULATORY_CARE_PROVIDER_SITE_OTHER): Payer: BLUE CROSS/BLUE SHIELD | Admitting: Family Medicine

## 2015-09-08 ENCOUNTER — Encounter: Payer: Self-pay | Admitting: Family Medicine

## 2015-09-08 VITALS — BP 110/68 | HR 86 | Temp 99.1°F | Ht 66.5 in | Wt 200.4 lb

## 2015-09-08 DIAGNOSIS — J069 Acute upper respiratory infection, unspecified: Secondary | ICD-10-CM

## 2015-09-08 DIAGNOSIS — R52 Pain, unspecified: Secondary | ICD-10-CM

## 2015-09-08 LAB — POCT INFLUENZA A/B
INFLUENZA A, POC: NEGATIVE
INFLUENZA B, POC: NEGATIVE

## 2015-09-08 MED ORDER — PREDNISONE 20 MG PO TABS: 20.0000 mg | ORAL_TABLET | Freq: Every day | ORAL | Status: DC

## 2015-09-08 NOTE — Assessment & Plan Note (Addendum)
Symptoms are likely related to a viral upper respiratory infection. This could be bronchitis. No focal findings on exam to indicate bacterial illness. Discussed possible treatment with the patient. Discussed there is no role for antibiotics at this time. Discussed possible use of prednisone and we agreed on short course of this. Orthostatics negative. Suspect lightheadedness could be related to dehydration. Vital signs are stable. Advised him to stay well hydrated. Given lightheadedness we'll additionally check a CBC and a BMP to evaluate for possible further causes. He is given return precautions.

## 2015-09-08 NOTE — Progress Notes (Signed)
Pre visit review using our clinic review tool, if applicable. No additional management support is needed unless otherwise documented below in the visit note. 

## 2015-09-08 NOTE — Patient Instructions (Signed)
Nice to meet you. Your symptoms are likely related to a viral illness and possibly bronchitis. You should stay well hydrated. We will treat you with prednisone for the next 5 days. We will check some basic lab work today as well. If you develop chest pain, shortness of breath, cough productive of blood, or fevers, or any new or changing symptoms please seek medical attention.

## 2015-09-08 NOTE — Progress Notes (Signed)
Patient ID: EWART PANDE, male   DOB: 02-May-1981, 35 y.o.   MRN: [id number]  Tommi Rumps, MD Phone: [phone]  JORON CARLOUGH is a 35 y.o. male who presents today for same-day visit.  Patient notes onset of symptoms last Friday. Notes his throat has been itchy and he has had a minimally productive cough since that time. Associated with overall weakness and some mild lightheadedness on standing. Does have some postnasal drip. No nasal or sinus congestion. Notes a sharp discomfort in his chest occasionally when he coughs though no other chest pain. No shortness of breath. Notes he has body aches and is sore in his back. No numbness or weakness in his legs. No loss of consciousness. No saddle anesthesia, loss of bowel or bladder function, or fevers. Has been taking amoxicillin that he had at home.  PMH: nonsmoker.   ROS see history of present illness  Objective  Physical Exam Filed Vitals:   09/08/15 1316  BP: 110/68  Pulse: 86  Temp: 99.1 F (37.3 C)   Laying blood pressure 110/68 pulse 76 Sitting blood pressure 104/76 pulse 81 Standing blood pressure 102/78 pulse 89  BP Readings from Last 3 Encounters:  09/08/15 110/68  02/02/15 96/60  12/07/14 124/76   Wt Readings from Last 3 Encounters:  09/08/15 200 lb 6.4 oz (90.901 kg)  02/02/15 185 lb (83.915 kg)  12/07/14 190 lb 8 oz (86.41 kg)    Physical Exam  Constitutional: He is well-developed, well-nourished, and in no distress.  HENT:  Head: Normocephalic and atraumatic.  Right Ear: External ear normal.  Left Ear: External ear normal.  Mouth/Throat: Oropharynx is clear and moist. No oropharyngeal exudate.  Normal TMs bilaterally  Eyes: Conjunctivae are normal. Pupils are equal, round, and reactive to light.  Neck: Neck supple.  Cardiovascular: Normal rate, regular rhythm and normal heart sounds.  Exam reveals no gallop and no friction rub.   No murmur heard. Pulmonary/Chest: Effort normal and breath  sounds normal. No respiratory distress. He has no wheezes. He has no rales.  Abdominal: Soft. Bowel sounds are normal. He exhibits no distension. There is no tenderness. There is no rebound and no guarding.  Musculoskeletal: He exhibits no edema.  Lymphadenopathy:    He has no cervical adenopathy.  Neurological: He is alert. Gait normal.  CN 2-12 intact, 5/5 strength in bilateral biceps, triceps, grip, quads, hamstrings, plantar and dorsiflexion, sensation to light touch intact in bilateral UE and LE, normal gait, 2+ patellar reflexes  Skin: Skin is warm and dry. He is not diaphoretic.     Assessment/Plan: Please see individual problem list.  Viral upper respiratory infection Symptoms are likely related to a viral upper respiratory infection. This could be bronchitis. No focal findings on exam to indicate bacterial illness. Discussed possible treatment with the patient. Discussed there is no role for antibiotics at this time. Discussed possible use of prednisone and we agreed on short course of this. Orthostatics negative. Suspect lightheadedness could be related to dehydration. Vital signs are stable. Advised him to stay well hydrated. Given lightheadedness we'll additionally check a CBC and a BMP to evaluate for possible further causes. He is given return precautions.    Orders Placed This Encounter  Procedures  . Basic Metabolic Panel (BMET)  . CBC  . POCT Influenza A/B    Meds ordered this encounter  Medications  . predniSONE (DELTASONE) 20 MG tablet    Sig: Take 1 tablet (20 mg total) by mouth daily with breakfast.  Dispense:  10 tablet    Refill:  0     Tommi Rumps, MD [place]

## 2015-09-09 LAB — CBC
Hematocrit: 44.7 % (ref 37.5–51.0)
Hemoglobin: 14.1 g/dL (ref 12.6–17.7)
MCH: 28.4 pg (ref 26.6–33.0)
MCHC: 31.5 g/dL (ref 31.5–35.7)
MCV: 90 fL (ref 79–97)
NRBC: 0 % (ref 0–0)
PLATELETS: 264 10*3/uL (ref 150–379)
RBC: 4.97 x10E6/uL (ref 4.14–5.80)
RDW: 12.8 % (ref 12.3–15.4)
WBC: 3.2 10*3/uL — ABNORMAL LOW (ref 3.4–10.8)

## 2015-09-09 LAB — BASIC METABOLIC PANEL
BUN/Creatinine Ratio: 13 (ref 8–19)
BUN: 12 mg/dL (ref 6–20)
CALCIUM: 9.1 mg/dL (ref 8.7–10.2)
CO2: 20 mmol/L (ref 18–29)
Chloride: 97 mmol/L (ref 96–106)
Creatinine, Ser: 0.89 mg/dL (ref 0.76–1.27)
GFR calc Af Amer: 127 mL/min/{1.73_m2} (ref >59)
GFR calc non Af Amer: 110 mL/min/{1.73_m2} (ref >59)
Glucose: 92 mg/dL (ref 65–99)
POTASSIUM: 4.3 mmol/L (ref 3.5–5.2)
SODIUM: 137 mmol/L (ref 134–144)

## 2015-09-11 ENCOUNTER — Other Ambulatory Visit: Payer: Self-pay | Admitting: Family Medicine

## 2015-09-11 DIAGNOSIS — D72819 Decreased white blood cell count, unspecified: Secondary | ICD-10-CM

## 2015-09-14 ENCOUNTER — Other Ambulatory Visit: Payer: Self-pay | Admitting: Family Medicine

## 2015-09-14 DIAGNOSIS — D72819 Decreased white blood cell count, unspecified: Secondary | ICD-10-CM

## 2015-09-19 ENCOUNTER — Other Ambulatory Visit: Payer: BLUE CROSS/BLUE SHIELD

## 2017-08-04 ENCOUNTER — Ambulatory Visit (INDEPENDENT_AMBULATORY_CARE_PROVIDER_SITE_OTHER): Payer: Managed Care, Other (non HMO) | Admitting: Family Medicine

## 2017-08-04 ENCOUNTER — Encounter: Payer: Self-pay | Admitting: Family Medicine

## 2017-08-04 VITALS — BP 126/62 | HR 55 | Temp 98.0°F | Ht 67.0 in | Wt 183.8 lb

## 2017-08-04 DIAGNOSIS — F430 Acute stress reaction: Secondary | ICD-10-CM

## 2017-08-04 DIAGNOSIS — Z0000 Encounter for general adult medical examination without abnormal findings: Secondary | ICD-10-CM

## 2017-08-04 DIAGNOSIS — L989 Disorder of the skin and subcutaneous tissue, unspecified: Secondary | ICD-10-CM | POA: Diagnosis not present

## 2017-08-04 MED ORDER — SERTRALINE HCL 50 MG PO TABS: 50.0000 mg | ORAL_TABLET | Freq: Every day | ORAL | 5 refills | Status: DC

## 2017-08-04 NOTE — Assessment & Plan Note (Signed)
Reviewed health habits including diet and exercise and skin cancer prevention Reviewed appropriate screening tests for age  Also reviewed health mt list, fam hx and immunization status , as well as social and family history   See HPI  Declines flu shot  Addressed mental health issues today  Ref to derm for atypical appearing nevus  Lab today

## 2017-08-04 NOTE — Patient Instructions (Addendum)
Look into the counseling opportunities at work for you   Definitely get started with marital counseling as well   Look into a mediation app for your phone- they can be very helpful   Exercise when you can   Take care of yourself   Labs today for wellness   For anxiety symptoms from stress  Start sertraline (generic zoloft) - 1/2 pill once daily for 2 weeks  If any intolerable sides effects or if you feel worse please stop it and let us know  Otherwise - at 2 weeks you can increase to a whole pill per day   We will refer you to dermatology to look at the mole on your back

## 2017-08-04 NOTE — Assessment & Plan Note (Signed)
5 mm nevus R back - flat/brown with irregular shape Ref to dermatology for eval

## 2017-08-04 NOTE — Progress Notes (Signed)
Subjective:    Patient ID: Christopher West, male    DOB: 05/10/1981, 37 y.o.   MRN: [id number]  HPI Here for health maintenance exam and to review chronic medical problems    Working a lot  Works at [employer]  (very stressful)  Wants to go back to school for radiology   A lot of stress  Marital problems and stress at work   Planning to go back for counseling (marital) again   Quite a bit of anxiety and panic attacks -interested in treatment  No drugs  No alcohol  Trying to work out when he can   He would be open to counseling (EAP is available)   Wt Readings from Last 3 Encounters:  08/04/17 183 lb 12 oz (83.3 kg)  09/08/15 200 lb 6.4 oz (90.9 kg)  02/02/15 185 lb (83.9 kg)  stress -not eating as much  Lost weight  He tends to skip breakfast  Does exercise  28.78 kg/m  Declines flu shots   Had Tdap 6/16  HIV screening -not interested  STD testing- not interested   Family hx -mother had breast cancer  No prostate or colon cancer  Father had MI last year - (due to drugs)   Wants to do labs    PHQ -9 score is 9  Patient Active Problem List   Diagnosis Date Noted  . Stress reaction 08/04/2017  . Skin lesion of back 08/04/2017  . Routine general medical examination at a health care facility 12/07/2014  . Skin lump of arm 04/30/2013  . LIPOMA 01/18/2010  . VENEREAL WART 05/27/2007  . 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 ALLERGY 05/26/2007   History reviewed. No pertinent past medical history. History reviewed. No pertinent surgical history. Social History   Tobacco Use  . Smoking status: Never Smoker  . Smokeless tobacco: Never Used  Substance Use Topics  . Alcohol use: Yes    Alcohol/week: 0.0 oz    Comment: Rare  . Drug use: Yes    Types: Marijuana    Comment: occ   History reviewed. No pertinent family history. Allergies  Allergen Reactions  . Ativan [Lorazepam] Anxiety   No current outpatient medications on file prior to visit.   No current  facility-administered medications on file prior to visit.     Review of Systems  Constitutional: Negative for activity change, appetite change, fatigue, fever and unexpected weight change.  HENT: Negative for congestion, rhinorrhea, sore throat and trouble swallowing.   Eyes: Negative for pain, redness, itching and visual disturbance.  Respiratory: Negative for cough, chest tightness, shortness of breath and wheezing.   Cardiovascular: Negative for chest pain and palpitations.  Gastrointestinal: Negative for abdominal pain, blood in stool, constipation, diarrhea and nausea.  Endocrine: Negative for cold intolerance, heat intolerance, polydipsia and polyuria.  Genitourinary: Negative for difficulty urinating, dysuria, frequency and urgency.  Musculoskeletal: Negative for arthralgias, joint swelling and myalgias.  Skin: Negative for pallor and rash.  Neurological: Negative for dizziness, tremors, weakness, numbness and headaches.  Hematological: Negative for adenopathy. Does not bruise/bleed easily.  Psychiatric/Behavioral: Positive for dysphoric mood. Negative for agitation, behavioral problems, decreased concentration, self-injury, sleep disturbance and suicidal ideas. The patient is nervous/anxious.        Objective:   Physical Exam  Constitutional: He appears well-developed and well-nourished. No distress.  Well appearing   HENT:  Head: Normocephalic and atraumatic.  Right Ear: External ear normal.  Left Ear: External ear normal.  Nose: Nose normal.  Mouth/Throat: Oropharynx  is clear and moist.  Eyes: Conjunctivae and EOM are normal. Pupils are equal, round, and reactive to light. Right eye exhibits no discharge. Left eye exhibits no discharge. No scleral icterus.  Neck: Normal range of motion. Neck supple. No JVD present. Carotid bruit is not present. No thyromegaly present.  Cardiovascular: Normal rate, regular rhythm, normal heart sounds and intact distal pulses. Exam reveals no  gallop.  Pulmonary/Chest: Effort normal and breath sounds normal. No respiratory distress. He has no wheezes. He exhibits no tenderness.  Abdominal: Soft. Bowel sounds are normal. He exhibits no distension, no abdominal bruit and no mass. There is no tenderness.  Musculoskeletal: He exhibits no edema or tenderness.  Lymphadenopathy:    He has no cervical adenopathy.  Neurological: He is alert. He has normal reflexes. No cranial nerve deficit. He exhibits normal muscle tone. Coordination normal.  Skin: Skin is warm and dry. No rash noted. No erythema. No pallor.  5 mm irregular flat brown nevus on R side of back  Solar lentigines diffusely   Psychiatric: His speech is normal and behavior is normal. Thought content normal. His mood appears anxious. His affect is not blunt, not labile and not inappropriate. Thought content is not paranoid. Cognition and memory are normal. He does not exhibit a depressed mood. He expresses no homicidal and no suicidal ideation.  Moderately anxious Talks candidly about stressors/work and marital problems           Assessment & Plan:   Problem List Items Addressed This Visit      Musculoskeletal and Integument   Skin lesion of back    5 mm nevus R back - flat/brown with irregular shape Ref to dermatology for eval       Relevant Orders   Ambulatory referral to Dermatology     Other   Routine general medical examination at a health care facility - Primary    Reviewed health habits including diet and exercise and skin cancer prevention Reviewed appropriate screening tests for age  Also reviewed health mt list, fam hx and immunization status , as well as social and family history   See HPI  Declines flu shot  Addressed mental health issues today  Ref to derm for atypical appearing nevus  Lab today        Relevant Orders   CBC with Differential/Platelet   Comprehensive metabolic panel   Lipid panel   TSH   Stress reaction    With anxiety and  panic attacks Reviewed stressors/ coping techniques/symptoms/ support sources/ tx options and side effects in detail today  Enc strongly to start counseling at work asap /he plans to  Also marriage counseling-planning  Disc self care and need for exercise  Trial of zoloft 25 mg titrate to 50 mg  Discussed expectations of SSRI medication including time to effectiveness and mechanism of action, also poss of side effects (early and late)- including mental fuzziness, weight or appetite change, nausea and poss of worse dep or anxiety (even suicidal thoughts)  Pt voiced understanding and will stop med and update if this occurs   Did warn of poss sexual side effects  inst given  F/u 6 weeks       Relevant Medications   sertraline (ZOLOFT) 50 MG tablet

## 2017-08-04 NOTE — Assessment & Plan Note (Signed)
With anxiety and panic attacks Reviewed stressors/ coping techniques/symptoms/ support sources/ tx options and side effects in detail today  Enc strongly to start counseling at work asap /he plans to  Also marriage counseling-planning  Disc self care and need for exercise  Trial of zoloft 25 mg titrate to 50 mg  Discussed expectations of SSRI medication including time to effectiveness and mechanism of action, also poss of side effects (early and late)- including mental fuzziness, weight or appetite change, nausea and poss of worse dep or anxiety (even suicidal thoughts)  Pt voiced understanding and will stop med and update if this occurs   Did warn of poss sexual side effects  inst given  F/u 6 weeks

## 2017-08-06 ENCOUNTER — Encounter: Payer: Self-pay | Admitting: *Deleted

## 2017-08-06 LAB — CBC WITH DIFFERENTIAL/PLATELET
Basophils Absolute: 0 10*3/uL (ref 0.0–0.2)
Basos: 0 %
EOS (ABSOLUTE): 0.1 10*3/uL (ref 0.0–0.4)
Eos: 1 %
Hematocrit: 42.7 % (ref 37.5–51.0)
Hemoglobin: 13.6 g/dL (ref 13.0–17.7)
IMMATURE GRANS (ABS): 0 10*3/uL (ref 0.0–0.1)
Immature Granulocytes: 0 %
LYMPHS: 33 %
Lymphocytes Absolute: 1.6 10*3/uL (ref 0.7–3.1)
MCH: 28.8 pg (ref 26.6–33.0)
MCHC: 31.9 g/dL (ref 31.5–35.7)
MCV: 90 fL (ref 79–97)
Monocytes Absolute: 0.5 10*3/uL (ref 0.1–0.9)
Monocytes: 9 %
NEUTROS ABS: 2.7 10*3/uL (ref 1.4–7.0)
Neutrophils: 57 %
Platelets: 379 10*3/uL (ref 150–379)
RBC: 4.73 x10E6/uL (ref 4.14–5.80)
RDW: 13 % (ref 12.3–15.4)
WBC: 4.9 10*3/uL (ref 3.4–10.8)

## 2017-08-06 LAB — LIPID PANEL
CHOLESTEROL TOTAL: 147 mg/dL (ref 100–199)
Chol/HDL Ratio: 2.6 ratio (ref 0.0–5.0)
HDL: 57 mg/dL (ref >39)
LDL Calculated: 78 mg/dL (ref 0–99)
Triglycerides: 58 mg/dL (ref 0–149)
VLDL Cholesterol Cal: 12 mg/dL (ref 5–40)

## 2017-08-06 LAB — TSH

## 2017-08-06 LAB — COMPREHENSIVE METABOLIC PANEL
ALT: 18 IU/L (ref 0–44)
AST: 13 IU/L (ref 0–40)
Albumin/Globulin Ratio: 1.2 (ref 1.2–2.2)
Albumin: 4.2 g/dL (ref 3.5–5.5)
Alkaline Phosphatase: 44 IU/L (ref 39–117)
BILIRUBIN TOTAL: 0.3 mg/dL (ref 0.0–1.2)
BUN/Creatinine Ratio: 11 (ref 9–20)
BUN: 11 mg/dL (ref 6–20)
CHLORIDE: 106 mmol/L (ref 96–106)
CO2: 21 mmol/L (ref 20–29)
Calcium: 9.2 mg/dL (ref 8.7–10.2)
Creatinine, Ser: 0.99 mg/dL (ref 0.76–1.27)
GFR calc Af Amer: 111 mL/min/{1.73_m2} (ref >59)
GFR calc non Af Amer: 96 mL/min/{1.73_m2} (ref >59)
Globulin, Total: 3.4 g/dL (ref 1.5–4.5)
Glucose: 92 mg/dL (ref 65–99)
Potassium: 4.2 mmol/L (ref 3.5–5.2)
Sodium: 146 mmol/L — ABNORMAL HIGH (ref 134–144)
TOTAL PROTEIN: 7.6 g/dL (ref 6.0–8.5)

## 2017-09-08 ENCOUNTER — Ambulatory Visit (INDEPENDENT_AMBULATORY_CARE_PROVIDER_SITE_OTHER): Payer: Managed Care, Other (non HMO) | Admitting: Family Medicine

## 2017-09-08 ENCOUNTER — Encounter: Payer: Self-pay | Admitting: Family Medicine

## 2017-09-08 VITALS — BP 106/64 | HR 58 | Temp 98.1°F | Ht 67.0 in | Wt 182.0 lb

## 2017-09-08 DIAGNOSIS — F430 Acute stress reaction: Secondary | ICD-10-CM

## 2017-09-08 MED ORDER — ESCITALOPRAM OXALATE 10 MG PO TABS: 10.0000 mg | ORAL_TABLET | Freq: Every day | ORAL | 11 refills | Status: DC

## 2017-09-08 NOTE — Progress Notes (Signed)
Subjective:    Patient ID: Christopher West, male    DOB: 1980-07-24, 37 y.o.   MRN: [id number]  HPI Here for f/u of chronic medical problems  Wt Readings from Last 3 Encounters:  09/08/17 182 lb (82.6 kg)  08/04/17 183 lb 12 oz (83.3 kg)  09/08/15 200 lb 6.4 oz (90.9 kg)   28.51 kg/m   Last visit discussed stress reaction and zoloft was started 25 mg titrate to 50 It makes him somewhat drowsy  Not helping his mood at all  No other side effects   More stress as well (although work is better)  [employer] a house in [place] and paid a bunch of bills with the $ Tax issues as well  Wife was upset -changed her mind about wanting the house they purchased  Is going to counseling with his pastor/minister She is "blaming him" for everything (she may have some personality issues/disorder)-major trust issues   He is doing EAP counseling at work as well  That is not very helpful  ? Unsure if he could get individual counseling    He may have to leave his relationship  Is hard with his faith /does not believe in it    Also disc counseling   With labs -his TSH was cancelled by [facility]  Wants to wait until next physical   Patient Active Problem List   Diagnosis Date Noted  . Stress reaction 08/04/2017  . Skin lesion of back 08/04/2017  . Routine general medical examination at a health care facility 12/07/2014  . Skin lump of arm 04/30/2013  . LIPOMA 01/18/2010  . VENEREAL WART 05/27/2007  . 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 ALLERGY 05/26/2007   No past medical history on file. No past surgical history on file. Social History   Tobacco Use  . Smoking status: Never Smoker  . Smokeless tobacco: Never Used  Substance Use Topics  . Alcohol use: Yes    Alcohol/week: 0.0 oz    Comment: Rare  . Drug use: Yes    Types: Marijuana    Comment: occ   No family history on file. Allergies  Allergen Reactions  . Ativan [Lorazepam] Anxiety   No current outpatient medications on file  prior to visit.   No current facility-administered medications on file prior to visit.     Review of Systems  Constitutional: Positive for fatigue. Negative for activity change, appetite change, fever and unexpected weight change.  HENT: Negative for congestion, rhinorrhea, sore throat and trouble swallowing.   Eyes: Negative for pain, redness, itching and visual disturbance.  Respiratory: Negative for cough, chest tightness, shortness of breath and wheezing.   Cardiovascular: Negative for chest pain and palpitations.  Gastrointestinal: Negative for abdominal pain, blood in stool, constipation, diarrhea and nausea.  Endocrine: Negative for cold intolerance, heat intolerance, polydipsia and polyuria.  Genitourinary: Negative for difficulty urinating, dysuria, frequency and urgency.  Musculoskeletal: Negative for arthralgias, joint swelling and myalgias.  Skin: Negative for pallor and rash.  Neurological: Negative for dizziness, tremors, weakness, numbness and headaches.  Hematological: Negative for adenopathy. Does not bruise/bleed easily.  Psychiatric/Behavioral: Positive for dysphoric mood. Negative for decreased concentration, self-injury, sleep disturbance and suicidal ideas. The patient is nervous/anxious.        Objective:   Physical Exam  Constitutional: He appears well-developed and well-nourished. No distress.  HENT:  Head: Normocephalic and atraumatic.  Mouth/Throat: Oropharynx is clear and moist.  Eyes: Pupils are equal, round, and reactive to light. Conjunctivae and  EOM are normal. No scleral icterus.  Neck: Normal range of motion. Neck supple. No thyromegaly present.  Cardiovascular: Normal rate, regular rhythm, normal heart sounds and intact distal pulses.  Pulmonary/Chest: Effort normal and breath sounds normal. No respiratory distress. He has no wheezes. He has no rales.  Musculoskeletal: He exhibits no edema.  Lymphadenopathy:    He has no cervical adenopathy.    Neurological: He is alert. He has normal reflexes. He displays no tremor. No cranial nerve deficit. Coordination normal.  Skin: Skin is warm and dry. No pallor.  Psychiatric: His speech is normal and behavior is normal. Thought content normal. His mood appears anxious. His affect is not blunt, not labile and not inappropriate. He is not agitated and not hyperactive. Thought content is not paranoid. Cognition and memory are normal. He exhibits a depressed mood. He expresses no homicidal and no suicidal ideation.  More anxious than depressed today  Disc stressors/life events candidly Not tearful or voicing hopelessness  Disc issues with counseling          Assessment & Plan:   Problem List Items Addressed This Visit      Other   Stress reaction - Primary    Social stressors have worsened  Stressed imp of self care Offered ref to ext counseling-he will consider this Continue pastoral and EAP counseling (these are marital)  zoloft not helpful-will change to lexapro 10 mg  Will call in 2 wk if he wants to inc dose to 20 Discussed expectations of SSRI medication including time to effectiveness and mechanism of action, also poss of side effects (early and late)- including mental fuzziness, weight or appetite change, nausea and poss of worse dep or anxiety (even suicidal thoughts)  Pt voiced understanding and will stop med and update if this occurs  F/u 6 weeks       Relevant Medications   escitalopram (LEXAPRO) 10 MG tablet      Problem List Items Addressed This Visit      Other   Stress reaction - Primary    Social stressors have worsened  Stressed imp of self care Offered ref to ext counseling-he will consider this Continue pastoral and EAP counseling (these are marital)  zoloft not helpful-will change to lexapro 10 mg  Will call in 2 wk if he wants to inc dose to 20 Discussed expectations of SSRI medication including time to effectiveness and mechanism of action, also poss of  side effects (early and late)- including mental fuzziness, weight or appetite change, nausea and poss of worse dep or anxiety (even suicidal thoughts)  Pt voiced understanding and will stop med and update if this occurs  F/u 6 weeks       Relevant Medications   escitalopram (LEXAPRO) 10 MG tablet

## 2017-09-08 NOTE — Assessment & Plan Note (Signed)
Social stressors have worsened  Stressed imp of self care Offered ref to ext counseling-he will consider this Continue pastoral and EAP counseling (these are marital)  zoloft not helpful-will change to lexapro 10 mg  Will call in 2 wk if he wants to inc dose to 20 Discussed expectations of SSRI medication including time to effectiveness and mechanism of action, also poss of side effects (early and late)- including mental fuzziness, weight or appetite change, nausea and poss of worse dep or anxiety (even suicidal thoughts)  Pt voiced understanding and will stop med and update if this occurs  F/u 6 weeks

## 2017-09-08 NOTE — Patient Instructions (Addendum)
We can refer you for individual counseling-please call and let me know if you want to do this  Please make sure to take care of yourself   Stop the zoloft and change to lexapro 10 mg once daily  Call us in 2 weeks if you want to increase the dose   Follow up in about 6 weeks

## 2017-09-15 ENCOUNTER — Ambulatory Visit: Payer: Managed Care, Other (non HMO) | Admitting: Family Medicine

## 2018-01-09 ENCOUNTER — Other Ambulatory Visit: Payer: Self-pay | Admitting: *Deleted

## 2018-01-09 MED ORDER — ESCITALOPRAM OXALATE 20 MG PO TABS: 20.0000 mg | ORAL_TABLET | Freq: Every day | ORAL | 11 refills | Status: DC

## 2018-01-09 NOTE — Telephone Encounter (Signed)
Pt notified of Dr. Marliss Coots comments and Rx sent to requested pharmacy

## 2018-01-09 NOTE — Telephone Encounter (Signed)
Copied from [place] [phone]. Topic: General - Other >> Jan 09, 2018 12:14 PM Mcneil, Ja-Kwan wrote: Reason for CRM: Pt states he was told to call in to request stronger dosage if needed. Pt has Rx for escitalopram (LEXAPRO) 10 MG tablet and requests stronger dosage. Cb# [phone]

## 2018-01-09 NOTE — Telephone Encounter (Signed)
I wrote for the 20 mg Unsure what pharmacy he prefers-please ask and send the pended px   If any side effects or problems please let me know

## 2018-06-17 ENCOUNTER — Ambulatory Visit: Payer: Managed Care, Other (non HMO) | Admitting: Family Medicine

## 2018-06-17 DIAGNOSIS — Z0289 Encounter for other administrative examinations: Secondary | ICD-10-CM

## 2019-07-19 ENCOUNTER — Ambulatory Visit: Payer: Managed Care, Other (non HMO) | Attending: Internal Medicine

## 2019-07-19 DIAGNOSIS — Z20822 Contact with and (suspected) exposure to covid-19: Secondary | ICD-10-CM

## 2019-07-20 LAB — SPECIMEN STATUS REPORT

## 2019-07-20 LAB — NOVEL CORONAVIRUS, NAA: SARS-CoV-2, NAA: NOT DETECTED

## 2019-07-21 ENCOUNTER — Telehealth: Payer: Self-pay | Admitting: Family Medicine

## 2019-07-21 NOTE — Telephone Encounter (Signed)
Patient informed of negative covid result.  °

## 2019-10-15 ENCOUNTER — Other Ambulatory Visit: Payer: Self-pay

## 2019-10-15 ENCOUNTER — Ambulatory Visit (INDEPENDENT_AMBULATORY_CARE_PROVIDER_SITE_OTHER): Payer: Managed Care, Other (non HMO) | Admitting: Family Medicine

## 2019-10-15 ENCOUNTER — Encounter: Payer: Self-pay | Admitting: Family Medicine

## 2019-10-15 VITALS — BP 118/68 | HR 63 | Temp 97.7°F | Ht 67.0 in | Wt 190.2 lb

## 2019-10-15 DIAGNOSIS — R300 Dysuria: Secondary | ICD-10-CM | POA: Diagnosis not present

## 2019-10-15 LAB — POC URINALSYSI DIPSTICK (AUTOMATED)
Bilirubin, UA: NEGATIVE
Blood, UA: NEGATIVE
Glucose, UA: NEGATIVE
Ketones, UA: NEGATIVE
Leukocytes, UA: NEGATIVE
Nitrite, UA: NEGATIVE
Protein, UA: NEGATIVE
Spec Grav, UA: >=1.03 — AB (ref 1.010–1.025)
Urobilinogen, UA: 0.2 E.U./dL
pH, UA: 6 (ref 5.0–8.0)

## 2019-10-15 NOTE — Assessment & Plan Note (Signed)
Urine is clear but concentrated (pending cx) Adv inc fluid intake to 64 oz per day (avoid bladder irritants)   Gc/chlam probe done (pt declined other std tests) Nl exam   Pending results inst to call if symptoms worsen

## 2019-10-15 NOTE — Patient Instructions (Signed)
Aim for a fluid intake of 64 oz per day (mostly water)  Aim to get urine very light in color   We will culture your urine -and call with result   Also sending a gc/chlamydia test - will call you and treat if positive  If symptoms suddenly worsen let me know

## 2019-10-15 NOTE — Progress Notes (Signed)
Subjective:    Patient ID: Christopher West, male    DOB: July 01, 1980, 39 y.o.   MRN: [id number] 'This visit occurred during the SARS-CoV-2 public health emergency.  Safety protocols were in place, including screening questions prior to the visit, additional usage of staff PPE, and extensive cleaning of exam room while observing appropriate contact time as indicated for disinfecting solutions.    HPI Pt presents with urinary symptoms   Symptoms started 3-4 wk ago  Very slight burning to urinate  Comes and goes  Happened today   Urine looked dark a few days ago   He drinks mainly water and carbonated water  occ sweet tea   Had unprotected intercourse in early march  No penile discharge or pain  No spots or sores   No rectal pressure or pain   No abd pain   ua is clear Results for orders placed or performed in visit on 10/15/19  POCT Urinalysis Dipstick (Automated)  Result Value Ref Range   Color, UA Light Yellow    Clarity, UA Clear    Glucose, UA Negative Negative   Bilirubin, UA Negative    Ketones, UA Negative    Spec Grav, UA >=1.030 (A) 1.010 - 1.025   Blood, UA Negative    pH, UA 6.0 5.0 - 8.0   Protein, UA Negative Negative   Urobilinogen, UA 0.2 0.2 or 1.0 E.U./dL   Nitrite, UA Negative    Leukocytes, UA Negative Negative    Patient Active Problem List   Diagnosis Date Noted  . Dysuria 10/15/2019  . Stress reaction 08/04/2017  . Skin lesion of back 08/04/2017  . Routine general medical examination at a health care facility 12/07/2014  . LIPOMA 01/18/2010  . VENEREAL WART 05/27/2007  . 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 ALLERGY 05/26/2007   History reviewed. No pertinent past medical history. History reviewed. No pertinent surgical history. Social History   Tobacco Use  . Smoking status: Never Smoker  . Smokeless tobacco: Never Used  Substance Use Topics  . Alcohol use: Yes    Alcohol/week: 0.0 standard drinks    Comment: Rare  . Drug use: Yes   Types: Marijuana    Comment: occ   History reviewed. No pertinent family history. Allergies  Allergen Reactions  . Ativan [Lorazepam] Anxiety   Current Outpatient Medications on File Prior to Visit  Medication Sig Dispense Refill  . VYVANSE 40 MG capsule Take 40 mg by mouth daily.     No current facility-administered medications on file prior to visit.     Review of Systems  Constitutional: Negative for activity change, appetite change, fatigue, fever and unexpected weight change.  HENT: Negative for congestion, rhinorrhea, sore throat and trouble swallowing.   Eyes: Negative for pain, redness, itching and visual disturbance.  Respiratory: Negative for cough, chest tightness, shortness of breath and wheezing.   Cardiovascular: Negative for chest pain and palpitations.  Gastrointestinal: Negative for abdominal pain, blood in stool, constipation, diarrhea and nausea.  Endocrine: Negative for cold intolerance, heat intolerance, polydipsia and polyuria.  Genitourinary: Positive for dysuria. Negative for difficulty urinating, discharge, flank pain, frequency, hematuria, penile pain, testicular pain and urgency.  Musculoskeletal: Negative for arthralgias, joint swelling and myalgias.  Skin: Negative for pallor and rash.  Neurological: Negative for dizziness, tremors, weakness, numbness and headaches.  Hematological: Negative for adenopathy. Does not bruise/bleed easily.  Psychiatric/Behavioral: Negative for decreased concentration and dysphoric mood. The patient is not nervous/anxious.  Objective:   Physical Exam Constitutional:      General: He is not in acute distress.    Appearance: Normal appearance. He is well-developed and normal weight. He is not ill-appearing or diaphoretic.  HENT:     Head: Normocephalic and atraumatic.  Eyes:     Conjunctiva/sclera: Conjunctivae normal.     Pupils: Pupils are equal, round, and reactive to light.  Cardiovascular:     Rate and  Rhythm: Normal rate and regular rhythm.     Heart sounds: Normal heart sounds.  Pulmonary:     Effort: Pulmonary effort is normal.     Breath sounds: Normal breath sounds.  Abdominal:     General: Bowel sounds are normal. There is no distension.     Palpations: Abdomen is soft.     Tenderness: There is abdominal tenderness. There is no rebound.     Comments: No cva tenderness  No suprapubic tenderness  Genitourinary:    Penis: Normal.      Comments: No genital lesions No penile d/c No tenderness No testicular lumps Musculoskeletal:     Cervical back: Normal range of motion and neck supple.  Lymphadenopathy:     Cervical: No cervical adenopathy.  Skin:    General: Skin is warm and dry.     Coloration: Skin is not pale.     Findings: No erythema or rash.  Neurological:     Mental Status: He is alert.  Psychiatric:        Mood and Affect: Mood normal.           Assessment & Plan:   Problem List Items Addressed This Visit      Other   Dysuria - Primary    Urine is clear but concentrated (pending cx) Adv inc fluid intake to 64 oz per day (avoid bladder irritants)   Gc/chlam probe done (pt declined other std tests) Nl exam   Pending results inst to call if symptoms worsen      Relevant Orders   POCT Urinalysis Dipstick (Automated) (Completed)   Ct, Ng, Mycoplasmas NAA, Swab   Urine Culture

## 2019-10-19 ENCOUNTER — Telehealth: Payer: Self-pay | Admitting: Family Medicine

## 2019-10-19 NOTE — Telephone Encounter (Signed)
Patient called to check on lab results. 

## 2019-10-19 NOTE — Telephone Encounter (Signed)
Aware, thanks!

## 2019-10-19 NOTE — Telephone Encounter (Signed)
Per Tam ([facility]) [facility] never picked up Friday's lab samples. They are picking it up to day and they said the [facility] probe is still good because it's in a preservative but they are not sure about the urine cx. [facility] is picking up samples today and hopefully will have results tomorrow or Thursday at the latest.  Called pt and advise him of this info and her is aware that we will call him once the results are back

## 2019-10-22 ENCOUNTER — Telehealth: Payer: Self-pay | Admitting: Family Medicine

## 2019-10-22 MED ORDER — AZITHROMYCIN 500 MG PO TABS: 2000.0000 mg | ORAL_TABLET | Freq: Once | ORAL | 0 refills | Status: AC

## 2019-10-22 NOTE — Telephone Encounter (Signed)
Azithromycin sent

## 2019-10-22 NOTE — Telephone Encounter (Signed)
Patient has been notified of these results and he verbalized understanding. Patient states he would like this medication to be sent to the [facility] on [address]

## 2019-10-22 NOTE — Telephone Encounter (Signed)
Urine culture is negative   The chlamydia test just returned positive -still waiting on the gonorrhea test  (we have had a bad experience with [facility] which is why this took so long)  I did message Terri to check on the gc test result   I want to go ahead and treat him with 2 g of azithromycin  I pended to send to pharmacy of choice Tell him to take whole dose at one time with food   Let us know if symptoms do not improve next week

## 2019-10-22 NOTE — Telephone Encounter (Signed)
I attempted to contact patient but VM box is full and I was unable to leave a message. I will try again later.

## 2019-10-22 NOTE — Addendum Note (Signed)
Addended by: Loura Pardon A on: 10/22/2019 02:02 PM   Modules accepted: Orders

## 2019-10-22 NOTE — Telephone Encounter (Signed)
Pt called to ask if anything can be sent in for him. He states he is still having the UTI symptoms.

## 2019-10-25 ENCOUNTER — Ambulatory Visit: Payer: Managed Care, Other (non HMO) | Admitting: Family Medicine

## 2019-10-25 DIAGNOSIS — Z0289 Encounter for other administrative examinations: Secondary | ICD-10-CM

## 2019-10-25 LAB — CT, NG, MYCOPLASMAS NAA, SWAB
Chlamydia trachomatis, NAA: POSITIVE — AB
Mycoplasma genitalium NAA: NEGATIVE
Mycoplasma hominis NAA: NEGATIVE
Neisseria gonorrhoeae, NAA: NEGATIVE
Ureaplasma spp NAA: NEGATIVE

## 2019-10-25 LAB — URINE CULTURE

## 2019-10-29 MED ORDER — AZITHROMYCIN 500 MG PO TABS: 2000.0000 mg | ORAL_TABLET | Freq: Once | ORAL | 0 refills | Status: AC

## 2019-10-29 NOTE — Addendum Note (Signed)
Addended by: Loura Pardon A on: 10/29/2019 07:56 AM   Modules accepted: Orders

## 2019-11-02 ENCOUNTER — Telehealth: Payer: Self-pay

## 2019-11-02 NOTE — Telephone Encounter (Signed)
Pt notified of Dr. Tower's comments and verbalized understanding  

## 2019-11-02 NOTE — Telephone Encounter (Signed)
Please let him know that was a mistake- I got a message that the first one may not have gone over electronically and I re sent it  If he took the first round that is all he needs Thanks for letting me know

## 2019-11-02 NOTE — Telephone Encounter (Signed)
Patient contacted the office. He states he was being treated for Chlamydia and was sent in an Azithromycin on 10/22/19. Patient states he has completed this, but another round of Azithromycin was sent in on 10/29/19. Patient is wondering if this was a mistake, or if he needs to take another round of this medication? Please advise.

## 2020-01-19 ENCOUNTER — Ambulatory Visit: Payer: Managed Care, Other (non HMO) | Admitting: Family Medicine

## 2020-02-01 ENCOUNTER — Encounter: Payer: Managed Care, Other (non HMO) | Admitting: Family Medicine

## 2020-02-01 DIAGNOSIS — Z0289 Encounter for other administrative examinations: Secondary | ICD-10-CM

## 2021-04-02 ENCOUNTER — Telehealth: Payer: Self-pay

## 2021-04-02 NOTE — Telephone Encounter (Signed)
Patient called in requesting a note for work stating that he is a non smoker.  He asked to see a provider for this.    When looking in his chart, I note that he hasn't been seen since 10/2019.  I did make him an office appointment for 04/11/21 at 12pm but am not sure if you want to make this an annual exam appointment?  This exceeds your session limits.  He needed the form for work completed before end of next week.    Let me know if we should schedule this differently.    Thanks.

## 2021-04-02 NOTE — Telephone Encounter (Signed)
That is just fine, does not have to be annual exam

## 2021-04-03 NOTE — Telephone Encounter (Signed)
Noted, thanks!

## 2021-04-11 ENCOUNTER — Other Ambulatory Visit: Payer: Self-pay

## 2021-04-11 ENCOUNTER — Encounter: Payer: Self-pay | Admitting: Family Medicine

## 2021-04-11 ENCOUNTER — Ambulatory Visit (INDEPENDENT_AMBULATORY_CARE_PROVIDER_SITE_OTHER): Payer: Managed Care, Other (non HMO) | Admitting: Family Medicine

## 2021-04-11 VITALS — BP 128/74 | HR 69 | Temp 98.3°F | Ht 67.0 in | Wt 203.5 lb

## 2021-04-11 DIAGNOSIS — F988 Other specified behavioral and emotional disorders with onset usually occurring in childhood and adolescence: Secondary | ICD-10-CM

## 2021-04-11 DIAGNOSIS — Z789 Other specified health status: Secondary | ICD-10-CM | POA: Insufficient documentation

## 2021-04-11 DIAGNOSIS — F430 Acute stress reaction: Secondary | ICD-10-CM | POA: Diagnosis not present

## 2021-04-11 NOTE — Patient Instructions (Signed)
Take care of yourself   Keep exercising   Get a flu shot when you get back in town

## 2021-04-11 NOTE — Assessment & Plan Note (Signed)
Much improved  Per pt was able to move on and stressors are improved  inst to keep [place] posted if he needs help

## 2021-04-11 NOTE — Progress Notes (Signed)
Subjective:    Patient ID: Christopher West, male    DOB: [birth year], 40 y.o.   MRN: [id number]  This visit occurred during the SARS-CoV-2 public health emergency.  Safety protocols were in place, including screening questions prior to the visit, additional usage of staff PPE, and extensive cleaning of exam room while observing appropriate contact time as indicated for disinfecting solutions.   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, occupational (position for non EMTs) Will apply tonight -has a connection  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    Works for [employer]  Just started in a new department  Not too different from what he was doing  Working with the [employer] /is interesting    Needs statement for work  Has a pledge to take re: he does not smoke  Will get more back $ if he does this    Taking vyvanse  ADD- started when he went back to school full time  It helps  Goes to [place] [facility]   Flu shot  Tdap 6/16   STD screening : declines need   Patient Active Problem List   Diagnosis Date Noted   Current non-smoker 04/11/2021   ADD (attention deficit disorder) 04/11/2021   Stress reaction 08/04/2017   Skin lesion of back 08/04/2017   Routine general medical examination at a health care facility 12/07/2014    LIPOMA 01/18/2010   VENEREAL WART 05/27/2007   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 No pertinent past medical history. History reviewed. No pertinent surgical history. Social History   Tobacco Use   Smoking status: Never   Smokeless tobacco: Never  Substance Use Topics   Alcohol use: Yes    Alcohol/week: 0.0 standard drinks    Comment: Rare   Drug use: Yes    Types: Marijuana    Comment: occ   History reviewed. No pertinent family history. Allergies  Allergen Reactions   Ativan [Lorazepam] Anxiety   Current Outpatient Medications on File Prior to Visit  Medication Sig Dispense Refill   VYVANSE 40 MG capsule Take 40 mg by mouth daily.     No current facility-administered medications on file prior to visit.     Review of Systems  Constitutional:  Negative for activity change, appetite change, fatigue, fever and unexpected weight change.  HENT:  Negative for congestion, rhinorrhea, sore throat and trouble swallowing.   Eyes:  Negative for pain, redness, itching and visual disturbance.  Respiratory:  Negative for cough, chest tightness, shortness of breath and wheezing.   Cardiovascular:  Negative for chest pain and palpitations.  Gastrointestinal:  Negative for abdominal pain, blood in stool, constipation, diarrhea and nausea.  Endocrine: Negative for cold intolerance, heat intolerance, polydipsia and polyuria.  Genitourinary:  Negative for difficulty urinating, dysuria, frequency and urgency.  Musculoskeletal:  Negative for arthralgias, joint swelling and myalgias.  Skin:  Negative for pallor and rash.  Neurological:  Negative for dizziness, tremors, weakness, numbness and headaches.  Hematological:  Negative for adenopathy. Does not bruise/bleed easily.  Psychiatric/Behavioral:  Negative for decreased concentration and dysphoric mood. The patient is not nervous/anxious.       Objective:   Physical Exam Constitutional:      General: He  is not in acute distress.    Appearance: Normal appearance. He is normal weight. He is not ill-appearing.     Comments: Muscular build  HENT:     Head: Normocephalic and atraumatic.  Eyes:     General: No scleral icterus.    Conjunctiva/sclera: Conjunctivae normal.     Pupils: Pupils are equal, round, and reactive to light.  Neck:     Vascular: No carotid bruit.  Cardiovascular:     Rate and Rhythm: Normal rate and regular rhythm.     Heart sounds: Normal heart sounds.  Pulmonary:     Effort: Pulmonary effort is normal. No respiratory distress.     Breath sounds: Normal breath sounds. No wheezing or rales.  Musculoskeletal:     Cervical back: Normal range of motion and neck supple.  Lymphadenopathy:     Cervical: No cervical adenopathy.  Skin:    General: Skin is warm and dry.     Coloration: Skin is not pale.     Findings: No rash.  Neurological:     Mental Status: He is alert.  Psychiatric:        Mood and Affect: Mood normal.          Assessment & Plan:   Problem List Items Addressed This Visit       Other   Stress reaction    Much improved  Per pt was able to move on and stressors are improved  inst to keep [place] posted if he needs help      Current non-smoker - Primary    He has never smoked or had the desire to  I commend that  Letter written for job as requested        ADD (attention deficit disorder)    Per pt life long and started treatment when starting back to school  Taking vyvanse 40 mg daily  (goes to [place] [facility])  Helpful  Also organization /lifestyle adjustment

## 2021-04-11 NOTE — Assessment & Plan Note (Signed)
Per pt life long and started treatment when starting back to school  Taking vyvanse 40 mg daily  (goes to [place] [facility])  Helpful  Also organization /lifestyle adjustment

## 2021-04-11 NOTE — Assessment & Plan Note (Signed)
He has never smoked or had the desire to  I commend that  Letter written for job as requested

## 2021-10-10 ENCOUNTER — Ambulatory Visit (INDEPENDENT_AMBULATORY_CARE_PROVIDER_SITE_OTHER)
Admission: RE | Admit: 2021-10-10 | Discharge: 2021-10-10 | Disposition: A | Discharge status: Home / Self Care | Payer: Managed Care, Other (non HMO) | Source: Ambulatory Visit | Attending: Family Medicine | Admitting: Family Medicine

## 2021-10-10 ENCOUNTER — Telehealth: Payer: Self-pay

## 2021-10-10 ENCOUNTER — Ambulatory Visit (INDEPENDENT_AMBULATORY_CARE_PROVIDER_SITE_OTHER): Payer: Managed Care, Other (non HMO) | Admitting: Family Medicine

## 2021-10-10 ENCOUNTER — Encounter: Payer: Self-pay | Admitting: Family Medicine

## 2021-10-10 VITALS — BP 132/84 | HR 57 | Temp 97.5°F | Ht 67.0 in | Wt 212.5 lb

## 2021-10-10 DIAGNOSIS — G8929 Other chronic pain: Secondary | ICD-10-CM

## 2021-10-10 DIAGNOSIS — M545 Low back pain, unspecified: Secondary | ICD-10-CM | POA: Diagnosis not present

## 2021-10-10 DIAGNOSIS — M5442 Lumbago with sciatica, left side: Secondary | ICD-10-CM | POA: Diagnosis not present

## 2021-10-10 LAB — POC URINALSYSI DIPSTICK (AUTOMATED)
Bilirubin, UA: NEGATIVE
Blood, UA: NEGATIVE
Glucose, UA: NEGATIVE
Ketones, UA: NEGATIVE
Leukocytes, UA: NEGATIVE
Nitrite, UA: NEGATIVE
Protein, UA: NEGATIVE
Spec Grav, UA: 1.025 (ref 1.010–1.025)
Urobilinogen, UA: 0.2 E.U./dL
pH, UA: 6 (ref 5.0–8.0)

## 2021-10-10 MED ORDER — PREDNISONE 10 MG PO TABS: ORAL_TABLET | ORAL | 0 refills | Status: DC

## 2021-10-10 NOTE — Patient Instructions (Addendum)
Be cautious with lifting  ? ?Keep walking  ? ?Do the sciatica rehab stretches  ? ?Try some heat for 10 minutes at a time  ?Take the prednisone as directed ?Flexeril with caution of sedation  ? ?Xray to day  ?We will contact you with result  ? ?Will make a plan after that  ? ? ? ? ? ?

## 2021-10-10 NOTE — Progress Notes (Signed)
? ?Subjective:  ? ? Patient ID: Christopher West, male    DOB: 04-Nov-1980, 41 y.o.   MRN: [id number] ? ?HPI ?Pt presents with low back pain  ? ?Wt Readings from Last 3 Encounters:  ?10/10/21 212 lb 8 oz (96.4 kg)  ?04/11/21 203 lb 8 oz (92.3 kg)  ?10/15/19 190 lb 3 oz (86.3 kg)  ? ?33.28 kg/m? ? ?Started 3-4 y ago  ?After changing blades on lawnmower- lifting/set it down  ? ?It started later- gradually  ?Noted he hurt after activity  ?Worse the last few weeks worse  ? ?L back, hip  ?Thigh and calf  ?Feels tingly  ?No weakness  ?No loss of BB  ? ?Hurts more to bend forward  ?Has to try and lie on L side  ?Uncomfortable at night  ? ?Some stretches  ?Used icy hot patch -not helpful  ?No otc medicines  ? ?Called web MD- px flexeril  ?Unsure if it helped  ? ? ?Ua is clear  ?Results for orders placed or performed in visit on 10/10/21  ?POCT Urinalysis Dipstick (Automated)  ?Result Value Ref Range  ? Color, UA Light Yellow   ? Clarity, UA Clear   ? Glucose, UA Negative Negative  ? Bilirubin, UA Negative   ? Ketones, UA Negative   ? Spec Grav, UA 1.025 1.010 - 1.025  ? Blood, UA Negative   ? pH, UA 6.0 5.0 - 8.0  ? Protein, UA Negative Negative  ? Urobilinogen, UA 0.2 0.2 or 1.0 E.U./dL  ? Nitrite, UA Negative   ? Leukocytes, UA Negative Negative  ? ?Patient Active Problem List  ? Diagnosis Date Noted  ? Low back pain 10/10/2021  ? Current non-smoker 04/11/2021  ? ADD (attention deficit disorder) 04/11/2021  ? Stress reaction 08/04/2017  ? Skin lesion of back 08/04/2017  ? Routine general medical examination at a health care facility 12/07/2014  ? LIPOMA 01/18/2010  ? VENEREAL WART 05/27/2007  ? 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 ALLERGY 05/26/2007  ? ?No past medical history on file. ?No past surgical history on file. ?Social History  ? ?Tobacco Use  ? Smoking status: Never  ? Smokeless tobacco: Never  ?Substance Use Topics  ? Alcohol use: Yes  ?  Alcohol/week: 0.0 standard drinks  ?  Comment: Rare  ? Drug use: Yes  ?  Types:  Marijuana  ?  Comment: occ  ? ?No family history on file. ?Allergies  ?Allergen Reactions  ? Ativan [Lorazepam] Anxiety  ? ?Current Outpatient Medications on File Prior to Visit  ?Medication Sig Dispense Refill  ? cyclobenzaprine (FLEXERIL) 10 MG tablet Take 10 mg by mouth 3 (three) times daily as needed.    ? VYVANSE 40 MG capsule Take 40 mg by mouth daily.    ? ?No current facility-administered medications on file prior to visit.  ?  ? ?Review of Systems  ?Constitutional:  Negative for activity change, appetite change, fatigue, fever and unexpected weight change.  ?HENT:  Negative for congestion, rhinorrhea, sore throat and trouble swallowing.   ?Eyes:  Negative for pain, redness, itching and visual disturbance.  ?Respiratory:  Negative for cough, chest tightness, shortness of breath and wheezing.   ?Cardiovascular:  Negative for chest pain and palpitations.  ?Gastrointestinal:  Negative for abdominal pain, blood in stool, constipation, diarrhea and nausea.  ?Endocrine: Negative for cold intolerance, heat intolerance, polydipsia and polyuria.  ?Genitourinary:  Negative for difficulty urinating, dysuria, frequency and urgency.  ?Musculoskeletal:  Positive for back pain.  Negative for arthralgias, joint swelling and myalgias.  ?Skin:  Negative for pallor and rash.  ?Neurological:  Negative for dizziness, tremors, weakness, numbness and headaches.  ?Hematological:  Negative for adenopathy. Does not bruise/bleed easily.  ?Psychiatric/Behavioral:  Negative for decreased concentration and dysphoric mood. The patient is not nervous/anxious.   ? ?   ?Objective:  ? Physical Exam ?Constitutional:   ?   General: He is not in acute distress. ?   Appearance: Normal appearance. He is well-developed. He is obese. He is not ill-appearing or diaphoretic.  ?HENT:  ?   Head: Normocephalic and atraumatic.  ?Eyes:  ?   General: No scleral icterus. ?   Conjunctiva/sclera: Conjunctivae normal.  ?   Pupils: Pupils are equal, round, and  reactive to light.  ?Cardiovascular:  ?   Rate and Rhythm: Regular rhythm. Bradycardia present.  ?Pulmonary:  ?   Effort: Pulmonary effort is normal.  ?   Breath sounds: Normal breath sounds. No wheezing or rales.  ?Abdominal:  ?   General: Bowel sounds are normal. There is no distension.  ?   Palpations: Abdomen is soft.  ?   Tenderness: There is no abdominal tenderness.  ?Musculoskeletal:     ?   General: Tenderness present.  ?   Cervical back: Normal range of motion and neck supple.  ?   Lumbar back: Spasms and tenderness present. No edema or bony tenderness. Decreased range of motion. Positive left straight leg raise test. Negative right straight leg raise test.  ?   Comments: Some loss of lordosis  ?No swelling or skin change ?No midline tenderness ?Some L lumbar muscle spasm and tenderness ?Pain with 30 deg flex and R lat flex ?Improved with extension  ?No neuro changes other than SLR on L  ?Lymphadenopathy:  ?   Cervical: No cervical adenopathy.  ?Skin: ?   General: Skin is warm and dry.  ?   Coloration: Skin is not pale.  ?   Findings: No erythema or rash.  ?Neurological:  ?   Mental Status: He is alert.  ?   Cranial Nerves: No cranial nerve deficit.  ?   Sensory: No sensory deficit.  ?   Motor: No weakness, atrophy or abnormal muscle tone.  ?   Coordination: Coordination normal.  ?   Deep Tendon Reflexes: Reflexes are normal and symmetric. Reflexes normal.  ?   Comments: Negative SLR  ?Psychiatric:     ?   Mood and Affect: Mood normal.  ? ? ? ? ? ?   ?Assessment & Plan:  ? ?Problem List Items Addressed This Visit   ? ?  ? Other  ? Low back pain - Primary  ?  Acute on chronic with L sided sciatic symptoms  ?Reassuring exam  ?Prednisone 40 mg taper (rev side eff) ?Heat/stretches (sciatic rehab handout given/reviewed) ? ?Xray LS today  ?Consider PT  ?Consider MRI if not imp with consv tx  ?Red flags/ER precautions noted (neuro change) ? ?  ?  ? Relevant Medications  ? cyclobenzaprine (FLEXERIL) 10 MG tablet  ?  predniSONE (DELTASONE) 10 MG tablet  ? Other Relevant Orders  ? DG Lumbar Spine Complete  ? POCT Urinalysis Dipstick (Automated) (Completed)  ? ? ?

## 2021-10-10 NOTE — Telephone Encounter (Addendum)
Adrianna RN with access nurse said pt experiencing lower lt back pain with no known injury and no urinary symptoms. Access disposition is to be seen within 4 hrs. Pt scheduled appt with Dr Glori Bickers 10/10/21 at 12 noon. UC & ED precautions given and pt voiced understanding.sending note to Dr Glori Bickers and Mora CMA. When access nurse note is available I will attach to this note. ? ? ? ?[facility] - Client ?TELEPHONE ADVICE RECORD ?AccessNurse? ?Patient ?Name: ?Christopher West ?Gender: Male ?DOB: 28-Aug-1980 ?Age: 41 Y 31 M 5 D ?Return ?Phone ?Number: ?[id number] ?(Primary), ?[id number] ?(Secondary) ?Address: ?City/ ?State/ ?Zip: [address] [place] [place] ? [address] ?Client [facility] - Client ?Client Site [place] ?Provider Tower, Roque Lias - MD ?Contact Type Call ?Who Is Calling Patient / Member / Family / Caregiver ?Call Type Triage / Clinical ?Relationship To Patient Self ?Return Phone Number [phone] (Primary) ?Chief Complaint Leg Pain ?Reason for Call Symptomatic / Request for Health Information ?Initial Comment Caller needs to schedule an appt for today, leg ?pain. ?Translation No ?Nurse Assessment ?Nurse: Altamease Oiler, RN, Adriana Date/Time (Eastern Time): 10/10/2021 8:55:34 AM ?Confirm and document reason for call. If ?symptomatic, describe symptoms. ?---pt states pain to lower back. on left side. radiates ?down to leg. has been ongoing for 3-4 weeks. pain of ?3/10, worse when standing of 7/10 but is intermittent ?Does the patient have any new or worsening ?symptoms? ---Yes ?Will a triage be completed? ---Yes ?Related visit to physician within the last 2 weeks? ---Yes ?Does the PT have any chronic conditions? (i.e. ?diabetes, asthma, this includes High risk factors for ?pregnancy, etc.) ?---No ?Is this a behavioral health or substance abuse call? ---No ?Guidelines ?Guideline Title Affirmed Question Affirmed Notes Nurse Date/Time  (Eastern ?Time) ?Leg Pain [1] Thigh or calf pain ?AND [2] only 1 side ?AND [3] present > ?1 hour (Exception: ?chronic unchanged ?pain) ?Altamease Oiler, RN, [place] 10/10/2021 8:58:53 AM ?Disp. Time (Eastern ?Time) Disposition Final User ?10/10/2021 9:00:47 AM See HCP within 4 Hours (or ?PCP triage) ?Yes Altamease Oiler, RN, Fabio Bering ?PLEASE NOTE: All timestamps contained within this report are represented as [place] Standard Time. ?CONFIDENTIALTY NOTICE: This fax transmission is intended only for the addressee. It contains information that is legally privileged, confidential or ?otherwise protected from use or disclosure. If you are not the intended recipient, you are strictly prohibited from reviewing, disclosing, copying using ?or disseminating any of this information or taking any action in reliance on or regarding this information. If you have received this fax in error, please ?notify us immediately by telephone so that we can arrange for its return to [place]. Phone: [phone], Toll-Free: [phone], Fax: [phone] ?Page: 2 of 2 ?Call Id: [id number] ?Caller Disagree/Comply Comply ?Caller Understands Yes ?PreDisposition Call Doctor ?Care Advice Given Per Guideline ?SEE HCP (OR PCP TRIAGE) WITHIN 4 HOURS: * IF OFFICE WILL BE OPEN: You need to be seen within the next 3 or 4 ?hours. Call your doctor (or NP/PA) now or as soon as the office opens. CALL BACK IF: * You become worse CARE ADVICE ?given per Leg Pain (Adult) guideline. ?Comments ?User: Kizzie Fantasia, RN Date/Time Eilene Ghazi Time): 10/10/2021 8:58:14 AM ?states he called MD web about CC and had muscle relaxers called in ?User: Kizzie Fantasia, RN Date/Time Eilene Ghazi Time): 10/10/2021 9:05:51 AM ?spoke to [employer], pt scheduled for 1200 appt ?Referrals ?REFERRED TO PCP OFFIC ?

## 2021-10-10 NOTE — Assessment & Plan Note (Signed)
Acute on chronic with L sided sciatic symptoms  ?Reassuring exam  ?Prednisone 40 mg taper (rev side eff) ?Heat/stretches (sciatic rehab handout given/reviewed) ? ?Xray LS today  ?Consider PT  ?Consider MRI if not imp with consv tx  ?Red flags/ER precautions noted (neuro change) ?

## 2021-10-12 NOTE — Addendum Note (Signed)
Addended by: Loura Pardon A on: 10/12/2021 07:14 PM ? ? Modules accepted: Orders ? ?

## 2021-10-19 ENCOUNTER — Telehealth: Payer: Self-pay | Admitting: Family Medicine

## 2021-10-19 DIAGNOSIS — Z0279 Encounter for issue of other medical certificate: Secondary | ICD-10-CM

## 2021-10-19 NOTE — Telephone Encounter (Signed)
Has he started PT yet? It looks like he may start on 5/16 if he has not already. I think that should help.   Please schedule f/u with me in 2-3 weeks and I will work on the fmla now ?

## 2021-10-19 NOTE — Telephone Encounter (Signed)
Before I can start the form please ask how he is doing and what days (first to last) he missed work ?Thanks  ?

## 2021-10-19 NOTE — Telephone Encounter (Signed)
Pt said he needed ppw for intermittent leave he said the pain is worse in the AM and and sometimes last about 1/2 his work day. Most days he can get through but some times he is late due to pain in AM. ? ?Pt said he missed the whole day on 10/10/21 (day of appt), and partial days on 10/12/21, 10/16/21, and 10/17/21 ? ?Given his multiple partial days they advised him go get FMLA so he will not get in trouble  ?

## 2021-10-19 NOTE — Telephone Encounter (Signed)
Form is in your in box

## 2021-10-19 NOTE — Telephone Encounter (Signed)
Type of forms received: FMLA ? ?Routed to:Shapale W ? ?Paperwork received by : Gwynn Burly ? ? ?Individual made aware of 3-5 business day turn around (Y/N): Y ? ?Form completed and patient made aware of charges(Y/N): Y ? ? ?Faxed to :  ? ?Form location: Place in folder ? ?

## 2021-10-22 NOTE — Therapy (Signed)
?OUTPATIENT PHYSICAL THERAPY EVALUATION ? ? ?Patient Name: Christopher West ?MRN: [id number] ?DOB:05-24-1981, 41 y.o., male ?Today's Date: 10/23/2021 ? ? PT End of Session - 10/23/21 1844   ? ? Visit Number 1   ? Number of Visits 24   ? Date for PT Re-Evaluation 01/15/22   ? Authorization Type [employer] reporting period from 10/23/2021   ? Authorization Time Period 60 per cal yr   ? Authorization - Visit Number 1   ? Authorization - Number of Visits 60   ? Progress Note Due on Visit 10   ? PT Start Time 1345   ? PT Stop Time 1430   ? PT Time Calculation (min) 45 min   ? Activity Tolerance Patient tolerated treatment well   ? Behavior During Therapy [facility] for tasks assessed/performed   ? ?  ?  ? ?  ? ? ?History reviewed. No pertinent past medical history. ?History reviewed. No pertinent surgical history. ?Patient Active Problem List  ? Diagnosis Date Noted  ? Low back pain 10/10/2021  ? Current non-smoker 04/11/2021  ? ADD (attention deficit disorder) 04/11/2021  ? Stress reaction 08/04/2017  ? Skin lesion of back 08/04/2017  ? Routine general medical examination at a health care facility 12/07/2014  ? LIPOMA 01/18/2010  ? VENEREAL WART 05/27/2007  ? 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 ALLERGY 05/26/2007  ? ? ?PCP: Abner Greenspan, MD ? ?REFERRING PROVIDER: Abner Greenspan, MD ? ?REFERRING DIAG: chronic left-sided low back pain with left-sided sciatica ? ?THERAPY DIAG:  ?Other low back pain ? ?Radiculopathy, lumbar region ? ?Difficulty in walking, not elsewhere classified ? ?Muscle weakness (generalized) ? ?ONSET DATE: approx 09/23/2021 ? ?SUBJECTIVE:                                                                                                                                                                                          ? ?SUBJECTIVE STATEMENT: ?Patient states he was changing the blades on his riding lawnmower. He states he was slowly lowering his riding lawnmower after having the edge lifted up and that is when his back  had a shooting pain so he dropped the lawnmower. This happened about 4 weeks ago.  He states his pain started in his left glute region but now he has pain to the bottom of his left heel and he most often feels it in his calf and knee. 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 He has never had back pain like that before. He was changing the blades on his riding lawnmower when he got  hurt and it is not fixed yet so he has tried using the push mower and that made him feel worse as well. His back was sore after he dropped the riding lawn mower but it really started hurting more after he used the push  mower about 3-4 days later. He reports tingling, burning, mostly tingling and discomfort. So far he has taken prednisone, stretches from youtube, resting, propping foot up and leaning forwards towards it, stated figure 4 stretch. He has taken a muscle relaxer. Heating pad did not do much.  ? ? ?PERTINENT HISTORY:  ?Patient is a 41 y.o. male who presents to outpatient physical therapy with a referral for medical diagnosis chronic left-sided low back pain with left-sided sciatica. 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 Relevant past medical history and comorbidities include ADD. Patient denies hx of cancer, stroke, seizures, lung problems, heart problems, diabetes, unexplained weight loss, unexplained changes in bowel or bladder problems, unexplained stumbling or dropping things, osteoporosis, and spinal surgery ? ? ? ?PAIN:  ?Are you having pain? Yes: NPRS scale: Current: 0/10/10,  Best: 0/10, Worst: 6/10. ?Pain location: low  back, worst at left glute and down back of thigh all the way to ankle or full foot at times.  ?Pain description: tingling, burning, mostly tingling and discomfort.  ?Aggravating factors: morning, prolonged sitting, after walking a lot, carrying something heavy.  ?Relieving factors: prednisone may have helped, feels better at 11 ? 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  ? ?PRECAUTIONS: None ? ?WEIGHT BEARING RESTRICTIONS No ? ?FALLS:  ?Has patient fallen in last 6 months? No ? ?LIVING ENVIRONMENT: ?Lives with: lives with their family, 6 people in total including kids.  ?Lives in: House/apartment ?Stairs: Yes: Internal: flight steps; on right going up and External: 2 steps; none ?Has following equipment at home: None ? 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  ? ?LEISURE: works on [employer] (lots of sitting), cares for kids, cooking, working out ? ?PLOF: Independent ? ?PATIENT GOALS "knock everything into place" ? ? ?OBJECTIVE ? ?DIAGNOSTIC FINDINGS:  ?Lumbar xray report 10/11/2021:  ?[place] 4+ VIEW ?  ?COMPARISON:  No recent prior. ?  ?FINDINGS: ?Lumbar spine numbered the lowest segmented appearing lumbar shaped ?vertebrae on lateral view as L5. Mild L5-S1 disc degeneration. No ?acute or focal bony abnormality identified. Pelvic calcifications ?consistent phleboliths. Soft tissues are unremarkable. ?  ?IMPRESSION: ?Mild L5-S1 disc degeneration, otherwise negative exam. No acute bony ?abnormality identified. ? ?SELF- REPORTED FUNCTION ?FOTO score: 50/100 (lumbar questionnaire) ? ?OBSERVATION/INSPECTION ?Posture ?Posture (standing): weight slightly shifted away from L LE.  ?Anthropometrics ?Tremor:  none ?Body composition: BMI: 32.2, muscular ?Muscle bulk: WFL bilaterally ?Skin: appears WFL where visualized ?Edema: none ?Functional Mobility ?Bed mobility: supine<> sit and rolling I but reports discomfort.  ?Transfers: sit <> stand I but reports discomfort.  ?Gait: grossly WFL for household and short community ambulation. More detailed gait analysis deferred to later date as needed. Reports discomfort.  ? ?SPINE MOTION ?  LUMBAR SPINE AROM ?*Indicates pain ?Flexion: fingers to mid shin, no pain (about normal range for him) ?Extension: 75% increased pain in left calf, no worse.  ?Side Flexion:  ? R WFL ? L ROM WFL, increased leg pain slightly, no worse ?Rotation:  ?R WFL ?L WFL ? ?NEUROLOGICAL ?Dermatomes ?L3-S2 appears equal and intact to light touch.  ?Deep Tendon Reflexes ?R/L  ?2+/2+ Quadriceps reflex (L4) ?2+/3+ Achilles reflex (S1) ? ?PERIPHERAL JOINT MOTION (in degrees) ?PASSIVE RANGE OF MOTION (PROM) ?B LE grossly WFL except restricted in hip extension bilaterally and mild discomfort with left hip ROM compared to right.  ? ?MUSCLE PERFORMANCE (MMT):  ?*Indicates pain 10/23/21 Date Date  ?Joint/Motion R/L R/L R/L  ?Hip     ?Flexion (L1, L2) 5/5 / /  ?Extension (knee ext) 5/4+* / /  ?Abduction 5/5 / /  ?Adduction / / /  ?External rotation / / /  ?Internal rotation  / / /  ?Knee     ?Extension (L3) 5/5 / /  ?Flexion (S2) 5/5 / /  ?Ankle/Foot     ?Dorsiflexion (L4) 5/4 / /  ?Great toe extension (L5) 5/4+* / /  ?Eversion (S1) 5/5 / /  ?Plantarflexion (S1) 5/5 / /  ?Comments:  ? ?SPECIAL TESTS: ?LOWER LIMB NEURODYNAMIC TESTS ?Straight Leg Raise (Sciatic nerve) ? R  = negative ? L  = positive ?Slump Test (entire nervous system) ? R = negative ? L = positive ?HIP SPECIAL TESTS ?FABER: R = negative, L = pain at L hip adductors. ? ?ACCESSORY MOTION: ?CPA to lumbar spine reproduces concordant pain in low back and down left leg to calf, especially at L4, L5, and sacrum.  ? ?PALPATION: ?TTP at left base of spine and less  so in left glute, posterior hip region.  ? ?REPEATED MOTIONS TESTING: ?- standing repeated lumbar extension, 1x10, peripheralized to left glute.  ?- prone press up, 1x10, peripheralized to calf for a few mi

## 2021-10-23 ENCOUNTER — Encounter: Payer: Self-pay | Admitting: Physical Therapy

## 2021-10-23 ENCOUNTER — Ambulatory Visit: Payer: Managed Care, Other (non HMO) | Attending: Family Medicine | Admitting: Physical Therapy

## 2021-10-23 DIAGNOSIS — M5442 Lumbago with sciatica, left side: Secondary | ICD-10-CM | POA: Diagnosis not present

## 2021-10-23 DIAGNOSIS — M6281 Muscle weakness (generalized): Secondary | ICD-10-CM | POA: Diagnosis present

## 2021-10-23 DIAGNOSIS — M5416 Radiculopathy, lumbar region: Secondary | ICD-10-CM | POA: Diagnosis present

## 2021-10-23 DIAGNOSIS — R262 Difficulty in walking, not elsewhere classified: Secondary | ICD-10-CM | POA: Diagnosis present

## 2021-10-23 DIAGNOSIS — M5459 Other low back pain: Secondary | ICD-10-CM | POA: Diagnosis present

## 2021-10-23 DIAGNOSIS — G8929 Other chronic pain: Secondary | ICD-10-CM | POA: Insufficient documentation

## 2021-10-23 NOTE — Telephone Encounter (Signed)
Called and spoke w/ pt   he is scheduled to start PT today and pt made an appt come in 3 week to follow up with you , for 11/12/21. Pt is aware of status of FLMA  ?

## 2021-10-25 ENCOUNTER — Ambulatory Visit: Payer: Managed Care, Other (non HMO) | Admitting: Physical Therapy

## 2021-10-25 ENCOUNTER — Encounter: Payer: Self-pay | Admitting: Physical Therapy

## 2021-10-25 DIAGNOSIS — M5459 Other low back pain: Secondary | ICD-10-CM | POA: Diagnosis not present

## 2021-10-25 DIAGNOSIS — M6281 Muscle weakness (generalized): Secondary | ICD-10-CM

## 2021-10-25 DIAGNOSIS — R262 Difficulty in walking, not elsewhere classified: Secondary | ICD-10-CM

## 2021-10-25 DIAGNOSIS — M5416 Radiculopathy, lumbar region: Secondary | ICD-10-CM

## 2021-10-25 NOTE — Therapy (Signed)
OUTPATIENT PHYSICAL THERAPY TREATMENT NOTE   Patient Name: Christopher West MRN: [id number] DOB:1980/08/27, 41 y.o., male Today's Date: 10/25/2021  PCP: Abner Greenspan, MD REFERRING PROVIDER: Abner Greenspan, MD  END OF SESSION:   PT End of Session - 10/25/21 1947     Visit Number 2    Number of Visits 24    Date for PT Re-Evaluation 01/15/22    Authorization Type [employer] reporting period from 10/23/2021    Authorization Time Period 60 per cal yr    Authorization - Visit Number 2    Authorization - Number of Visits 60    Progress Note Due on Visit 10    PT Start Time 1605    PT Stop Time 1645    PT Time Calculation (min) 40 min    Activity Tolerance Patient tolerated treatment well    Behavior During Therapy [facility] for tasks assessed/performed             History reviewed. No pertinent past medical history. History reviewed. No pertinent surgical history. 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 male who presents to outpatient physical therapy with a referral for medical diagnosis chronic left-sided low back pain with left-sided sciatica. 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 Relevant past medical history and comorbidities include ADD. Patient denies hx of cancer, stroke, seizures, lung problems, heart problems, diabetes, unexplained weight loss, unexplained changes in bowel or bladder problems, unexplained stumbling or dropping things, osteoporosis, and spinal surgery.  PRECAUTIONS: None  SUBJECTIVE: Patient states he feels well today and has not pain upon arrival. He states he had to call out for the first 2 hours of work this morning because his pain was so bad in the morning. His pain got better today and he did not have pain until about an hour ago when he was walking at work.   PAIN:  Are you having pain? No, but had pain earlier today.    OBJECTIVE:   TODAY'S TREATMENT  Therapeutic exercise: to centralize symptoms and improve ROM, strength, muscular endurance, and activity tolerance required for successful completion of functional activities.  - NuStep level 5 using bilateral upper and lower extremities. Seat/handle setting 10/10. For improved extremity mobility, muscular endurance, and activity tolerance; and to induce the analgesic effect of aerobic exercise, stimulate improved joint nutrition, and prepare body structures and systems for following interventions. x 5  minutes. Average SPM = 55. (Manual therapy/dry needling - see below) - prone press up 2x10 (mild centralization) - prone press up with clinician  overpressure, 2x10 (symptoms abolished except some paresthesia at left lateral thigh).  - standing lumbar extension, 1x10 - standing lumbar extension over plinth, 1x10 - education on seated posture with lumbar roll - Education on HEP including handout   Manual therapy: to reduce pain and tissue tension, improve range of  motion, neuromodulation, in order to promote improved ability to complete functional activities. PRONE - STM to bilateral lumbar paraspinals and left glute region   Modality: (unbilled) Dry needling performed to low back to decrease pain and spasms along patient's lumbar and left LE region with patient in prone utilizing 3 dry needle(s) .14m x 665mwith 1 stick at each level each side at approximately L5 and S1. Patient educated about the risks and benefits from therapy and verbally consents to treatment.  Dry needling performed by SaEverlean AlstromSnGraylon GoodT, DPT who is certified in this technique.  Pt required multimodal cuing for proper technique and to facilitate improved neuromuscular control, strength, range of motion, and functional ability resulting in improved performance and form.         PATIENT EDUCATION:  Education details: Exercise purpose/form. Self management techniques. Education on diagnosis, prognosis, POC, anatomy and physiology of current condition Education on HEP including handout  Person educated: Patient Education method: Explanation, Demonstration, Tactile cues, Verbal cues, and Handouts Education comprehension: verbalized understanding, returned demonstration, verbal cues required, tactile cues required, and needs further education     HOME EXERCISE PROGRAM: Access Code: [email or web address]: [email or web address] Date: 10/25/2021 Prepared by: SaRosita KeaExercises - Seated Correct Posture  - Prone Press Up  - 8 x daily - 20 reps - 1 second hold - Standing Lumbar Extension with Counter  - 8 x daily - 1 sets - 20 reps - 1 second hold - Standing Lumbar Extension  - 8 x daily - 1 sets - 20 reps - 1 second hold - Supine 90/90 Sciatic Nerve Glide with Knee Flexion/Extension  - 2 x daily - 15 reps   ASSESSMENT:   CLINICAL IMPRESSION: Patient tolerated treatment well overall and reported no pain or symptoms by end of session. Patient appeared to respond to lumbar  extension in lying with clinician overpressure and HEP was updated to include frequent lumbar extension exercises for a trial of this intervention over the next few days. Plan to assess response and adjust interventions as appropriate next session. Patient would benefit from continued management of limiting condition by skilled physical therapist to address remaining impairments and functional limitations to work towards stated goals and return to PLOF or maximal functional independence.   Patient is a [age].o. male referred to outpatient physical therapy with a medical diagnosis of chronic left-sided low back pain with left-sided sciatica who presents with signs and symptoms consistent with subacute L sided low back pain with L4 radiculopathy. Patient has weakness at the left ankle dorsiflexors that condition is affecting the L4 nerve root. 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 Patient will benefit from skilled physical therapy intervention to address current body structure impairments and activity limitations to improve function and work towards goals set in current POC in order to return to prior level of function or maximal functional improvement.    OBJECTIVE IMPAIRMENTS decreased activity tolerance, decreased endurance,  decreased knowledge of condition, decreased mobility, difficulty walking, decreased ROM, decreased strength, impaired perceived functional ability, increased muscle spasms, impaired flexibility, and pain.    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    PERSONAL FACTORS Age, Profession, Time since onset of injury/illness/exacerbation, and 1 comorbidity: ADD  are also affecting patient's functional outcome.      REHAB POTENTIAL: Good   CLINICAL DECISION MAKING: Stable/uncomplicated   EVALUATION COMPLEXITY: Low     GOALS: Goals reviewed with patient? No   SHORT TERM GOALS: Target date: 11/06/2021   Patient will be independent with initial home exercise program for self-management of symptoms. Baseline: Initial HEP provided at IE (10/23/21); Goal status: In-progress     LONG TERM GOALS: Target date: 01/15/2022   Patient will be independent with a long-term home exercise program for self-management of symptoms.  Baseline: Initial HEP provided at IE (10/23/21); Goal status: In-progress   2.  Patient will demonstrate improved FOTO to equal or greater than 69 by visit #12 to demonstrate improvement in overall condition and self-reported functional ability.  Baseline: 50 (10/23/21); Goal status: In-progress   3.  Patient will demonstrate full lumbar AROM with no increase in pain or symptoms except for intermittent end range discomfort to improve his ability to complete work activities, get in and out of the car, and get dressed in the morning.  Baseline: limited and painful - see objective exam (10/23/21); Goal status: In-progress   4.  Patient will demonstrate B LE strength equal or greater than 5/5 MMT with no increase in pain to improve his ability to work out, care for his kids, and complete lifting activities.  Baseline: some motions painful and weak, especially left ankle dorsiflexion (10/23/21); Goal status: In-progress   5.  Patient will complete community, work and/or recreational activities without limitation due to current condition.  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   PLAN FOR NEXT SESSION: update HEP as appropriate, specific exercise for directional preference as appropriate, manual therapy and dry needling as needed, progressive core/LE/functional strengthening as tolerated.      Everlean Alstrom. Graylon Good, PT, DPT 10/25/21, 7:54 PM  [facility] [address] [place], [place] [address] P: [phone] I F: [phone]

## 2021-10-30 ENCOUNTER — Encounter: Payer: Self-pay | Admitting: Physical Therapy

## 2021-10-30 ENCOUNTER — Ambulatory Visit: Payer: Managed Care, Other (non HMO) | Admitting: Physical Therapy

## 2021-10-30 DIAGNOSIS — M5416 Radiculopathy, lumbar region: Secondary | ICD-10-CM

## 2021-10-30 DIAGNOSIS — M6281 Muscle weakness (generalized): Secondary | ICD-10-CM

## 2021-10-30 DIAGNOSIS — R262 Difficulty in walking, not elsewhere classified: Secondary | ICD-10-CM

## 2021-10-30 DIAGNOSIS — M5459 Other low back pain: Secondary | ICD-10-CM

## 2021-10-30 NOTE — Therapy (Addendum)
OUTPATIENT PHYSICAL THERAPY TREATMENT NOTE   Patient Name: Christopher West MRN: [id number] DOB:[birth year], 41 y.o., male Today's Date: 10/30/2021  PCP: Abner Greenspan, MD REFERRING PROVIDER: Abner Greenspan, MD  END OF SESSION:   PT End of Session - 10/30/21 1338     Visit Number 3    Number of Visits 24    Date for PT Re-Evaluation 01/15/22    Authorization Type [employer] reporting period from 10/23/2021    Authorization Time Period 60 per cal yr    Authorization - Visit Number 3    Authorization - Number of Visits 60    Progress Note Due on Visit 10    PT Start Time 1304    PT Stop Time 1342    PT Time Calculation (min) 38 min    Activity Tolerance Patient tolerated treatment well    Behavior During Therapy [facility] for tasks assessed/performed              History reviewed. No pertinent past medical history. History reviewed. No pertinent surgical history. 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 male who presents to outpatient physical therapy with a referral for medical diagnosis chronic left-sided low back pain with left-sided sciatica. 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 Relevant past medical history and comorbidities include ADD. Patient denies hx of cancer, stroke, seizures, lung problems, heart problems, diabetes, unexplained weight loss, unexplained changes in bowel or bladder problems, unexplained stumbling or dropping things, osteoporosis, and spinal surgery.  PRECAUTIONS: None  SUBJECTIVE: Patient reports he can go significant time without pain but he seems to get pain when he is trying to help his kids or is in a rush. He states his pain is worse in the morning. He is not having as much pain at work. Patient states the extension exercises added to his HEP seems to calm it down.  Patient reports 0/10 pain upon arrival but was limping upon arrival favoring L LE. Patient felt he gained more hope after last PT session but he is not sure if he had any change in pain. He states he is awoken about 3-4 times a night but he is able to flip over and go back to sleep. His mattress is relatively new and it is soft on top. He would prefer a mattress that is a bit firmer. He sleeps on his stomach. He got up and sneezed once and almost hit the floor a long time ago (before he got married maybe 13 years ago). States he is fearful of pull  ups for some reason.   PAIN:  Are you having pain? No, but had pain earlier today.    OBJECTIVE:   TODAY'S TREATMENT  Therapeutic exercise: to centralize symptoms and improve ROM, strength, muscular endurance, and activity tolerance required for successful completion of functional activities.  - Treadmill 2.5 mph at 0% grade with B UE support. For improved lower extremity mobility, muscular endurance, and weightbearing activity tolerance; and to induce the analgesic effect of aerobic exercise, stimulate  improved joint nutrition, and prepare body structures and systems for following interventions. x 5:34  minutes.  - prone press up, 3x10 (no symptoms) - prone alternating hip extension with abdominal brace (multifidus kick), 2x10 each side (reported increasing pain in left glute by end of second set so discontinued).  - prone press up 2x10 (slightly better)  - prone press up with clinician overpressure at approximately L5, 3x10 (2nd/3rd set with yoga blocks under his hands to improve ROM, pain abolished after 2nd set).   - step standing pallof press with black theraband, 3x10 each side with each foot front  Pt required multimodal cuing for proper technique and to facilitate improved neuromuscular control, strength, range of motion, and functional ability resulting in improved performance and form.     PATIENT EDUCATION:  Education details: Exercise purpose/form. Self management techniques. Education on diagnosis, prognosis, POC, anatomy and physiology of current condition Education on HEP including handout  Person educated: Patient Education method: Explanation, Demonstration, Tactile cues, Verbal cues, and Handouts Education comprehension: verbalized understanding, returned demonstration, verbal cues required, tactile cues required, and needs further education     HOME EXERCISE PROGRAM: Access Code: [email or web address] URL: [email or web address] Date: 10/25/2021 Prepared by: Rosita Kea  Exercises - Seated Correct Posture  - Prone Press Up  - 8 x daily - 20 reps - 1 second hold - Standing Lumbar Extension with Counter  - 8 x daily - 1 sets - 20 reps - 1 second hold - Standing Lumbar Extension  - 8 x daily - 1 sets - 20 reps - 1 second hold - Supine 90/90 Sciatic Nerve Glide with Knee Flexion/Extension  - 2 x daily - 15 reps   ASSESSMENT:   CLINICAL IMPRESSION: Patient tolerated treatment well overall and pain produced during exercise was abolished with specific exercise for  extension preference. Patient encouraged to use something under his hands to increase ROM during prone press ups at home. Plan to show him how to use belt for press ups at next session if appropriate and continue core and functional strengthening as tolerated.Patient would benefit from continued management of limiting condition by skilled physical therapist to address remaining impairments and functional limitations to work towards stated goals and return to PLOF or maximal functional independence.   Patient is a 41 y.o. male referred to outpatient physical therapy with a medical diagnosis of chronic left-sided low back pain with left-sided sciatica who presents with signs and symptoms consistent with subacute L sided low back pain with L4 radiculopathy. Patient has weakness at the left ankle dorsiflexors that condition is affecting the L4 nerve root. 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 Patient will benefit from skilled physical therapy intervention to address current body structure impairments and activity limitations to improve function and work  towards goals set in current POC in order to return to prior level of function or maximal functional improvement.    OBJECTIVE IMPAIRMENTS decreased activity tolerance, decreased endurance, decreased knowledge of condition, decreased mobility, difficulty walking, decreased ROM, decreased strength, impaired perceived functional ability, increased muscle spasms, impaired flexibility, and pain.    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    PERSONAL FACTORS Age, Profession, Time since onset of injury/illness/exacerbation, and 1 comorbidity: ADD  are also affecting patient's functional outcome.      REHAB POTENTIAL: Good   CLINICAL DECISION MAKING: Stable/uncomplicated   EVALUATION COMPLEXITY: Low     GOALS: Goals reviewed with patient? No   SHORT TERM GOALS: Target date: 11/06/2021   Patient will be independent with initial home exercise program for self-management of symptoms. Baseline: Initial HEP provided at IE (10/23/21); Goal status: In-progress     LONG TERM GOALS: Target date: 01/15/2022   Patient will be independent with a long-term home exercise program for self-management of symptoms.  Baseline: Initial HEP provided at IE (10/23/21); Goal status: In-progress   2.  Patient will demonstrate improved FOTO to equal or greater than 69 by visit #12 to demonstrate improvement in overall condition and self-reported functional ability.  Baseline: 50 (10/23/21); Goal status: In-progress   3.  Patient will demonstrate full lumbar AROM with no increase in pain or symptoms except for intermittent end range discomfort to improve his ability to complete work activities, get in and out of the car, and get dressed in the morning.  Baseline: limited and painful - see objective exam (10/23/21); Goal status: In-progress   4.  Patient will demonstrate B LE strength equal or greater than 5/5 MMT with no increase in pain to improve his ability to work out, care for his kids, and complete lifting activities.  Baseline: some motions painful and weak, especially left ankle dorsiflexion (10/23/21); Goal status: In-progress   5.  Patient will complete community, work and/or recreational activities without limitation due to current condition.  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   PLAN FOR NEXT SESSION: update HEP as appropriate, specific exercise for directional preference as appropriate, manual therapy and dry needling as needed, progressive core/LE/functional strengthening as tolerated.      Everlean Alstrom. Graylon Good, PT, DPT 10/30/21, 1:44 PM  [facility] [address] [place], [place] [address] P: [phone] I F: [phone]

## 2021-10-31 NOTE — Therapy (Signed)
OUTPATIENT PHYSICAL THERAPY TREATMENT NOTE   Patient Name: Christopher West MRN: [id number] DOB:1981-06-05, 41 y.o., male Today's Date: 11/01/2021  PCP: Abner Greenspan, MD REFERRING PROVIDER: Abner Greenspan, MD  END OF SESSION:   PT End of Session - 11/01/21 1221     Visit Number 4    Number of Visits 24    Date for PT Re-Evaluation 01/15/22    Authorization Type [employer] reporting period from 10/23/2021    Authorization Time Period 60 per cal yr    Authorization - Visit Number 4    Authorization - Number of Visits 60    Progress Note Due on Visit 10    PT Start Time 1123    PT Stop Time 1203    PT Time Calculation (min) 40 min    Activity Tolerance Patient tolerated treatment well    Behavior During Therapy [facility] for tasks assessed/performed               History reviewed. No pertinent past medical history. History reviewed. No pertinent surgical history. 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 male who presents to outpatient physical therapy with a referral for medical diagnosis chronic left-sided low back pain with left-sided sciatica. 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 Relevant past medical history and comorbidities include ADD. Patient denies hx of cancer, stroke, seizures, lung problems, heart problems, diabetes, unexplained weight loss, unexplained changes in bowel or bladder problems, unexplained stumbling or dropping things, osteoporosis, and spinal surgery.  PRECAUTIONS: None  SUBJECTIVE: Patient reports he is feeling about the same today but his pain was not as bad this morning after he slept with a pillow under his hips where he lays prone. He feels like the exercises continue to help some. He currently does not have any pain. He did not find anything to put his hands on for prone press up. He states he felt good after last PT session. He had pain again when he woke up the next morning. He is taking his family to [employer] in 2 weeks and he is really hoping his back is better by then due to a lot of walking in the hot sun.   PAIN:  Are you having pain? No  OBJECTIVE:   MMT Left ankle DF 4/5, Left great toe extension 4/+/5.   TODAY'S TREATMENT  Therapeutic exercise: to centralize symptoms and improve ROM, strength, muscular endurance, and activity tolerance required for successful completion of functional activities.  - Treadmill 2.2 mph at 0% grade with B UE  support. For improved lower extremity mobility, muscular endurance, and weightbearing activity tolerance; and to induce the analgesic effect of aerobic exercise, stimulate improved joint nutrition, and prepare body structures and systems for following interventions. x 5 minutes.  - prone press up with yoga blocks under hands to deepen stretch, 3x10 (no symptoms) - half kneeing multifidus press, 2x10 each side with each leg up using BlackTB single band then loop.   - hooklying bridge 1x10 - hooklying bridge with marching, 2x10 each side.  - seated lat pull down, 2x10 at 55# - standing B ankle dorsiflexion with heels on 2x4 board, 3x10 with B UE support.  - single leg runner's step up to 12 inch step with U UE support, 2x10 each side.  - prone press up with yoga blocks under hands,  2x10  Pt required multimodal cuing for proper technique and to facilitate improved neuromuscular control, strength, range of motion, and functional ability resulting in improved performance and form.     PATIENT EDUCATION:  Education details: Exercise purpose/form. Self management techniques. Education on diagnosis, prognosis, POC, anatomy and physiology of current condition Education on HEP including handout  Person educated: Patient Education method: Explanation, Demonstration, Tactile cues, Verbal cues, and Handouts Education comprehension: verbalized understanding, returned demonstration, verbal cues required, tactile cues required, and needs further education     HOME EXERCISE PROGRAM: Access Code: [email or web address] URL: [email or web address] Date: 11/01/2021 Prepared by: Rosita Kea  Exercises - Seated Correct Posture  - Prone Press Up  - 8 x daily - 20 reps - 1 second hold - Standing Lumbar Extension with Counter  - 8 x daily - 1 sets - 20 reps - 1 second hold - Standing Lumbar Extension  - 8 x daily - 1 sets - 20 reps - 1 second hold - Supine 90/90 Sciatic Nerve Glide with Knee Flexion/Extension  - 2 x daily - 15 reps - Half Kneeling Anti-Rotation Press With Shoulder Flexion and Anchored Resistance  - 3-5 x weekly - 3 sets - 10 reps - 1-5 seconds hold - Marching Bridge  - 3-5 x weekly - 2-3 sets - 10 reps   ASSESSMENT:   CLINICAL IMPRESSION: {Patient tolerated treatment well with no pain or paresthesia throughout session. Patient continues to respond to extension exercises and was able to progress to strengthening with no increase in symptoms. HEP  updated to include strengthening for improved long term recovery. Weakness at patient's left ankle dorsiflexors and great toe extension has not changed since initial eval. Plan to continue specific exercise with progressive core/LE/functional strengthening as appropriate next session. Patient would benefit from continued management of limiting condition by skilled physical therapist to address remaining impairments and functional limitations to work towards stated goals and return to PLOF or maximal functional independence.   Patient is a 41 y.o. male referred to outpatient physical therapy with a medical diagnosis of chronic left-sided low back pain with left-sided sciatica who presents with signs and symptoms consistent with subacute L sided low back pain with L4 radiculopathy. Patient has weakness at the left ankle dorsiflexors that condition is affecting the L4 nerve root. 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 Patient will benefit from skilled physical therapy intervention to address current body structure impairments and activity limitations  to improve function and work towards goals set in current POC in order to return to prior level of function or maximal functional improvement.    OBJECTIVE IMPAIRMENTS decreased activity tolerance, decreased endurance, decreased knowledge of condition, decreased mobility, difficulty walking, decreased ROM, decreased strength, impaired perceived functional ability, increased muscle spasms, impaired flexibility, and pain.    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    PERSONAL FACTORS Age, Profession, Time since onset of injury/illness/exacerbation, and 1 comorbidity: ADD  are also affecting patient's functional outcome.      REHAB POTENTIAL: Good   CLINICAL DECISION MAKING: Stable/uncomplicated   EVALUATION COMPLEXITY: Low     GOALS: Goals reviewed with patient? No   SHORT TERM GOALS: Target date: 11/06/2021   Patient will be independent with initial home exercise program for self-management of symptoms. Baseline: Initial HEP provided at IE (10/23/21); Goal status: In-progress     LONG TERM GOALS: Target date: 01/15/2022   Patient will be independent with a long-term home exercise program for self-management of symptoms.  Baseline: Initial HEP provided at IE (10/23/21); Goal status: In-progress   2.  Patient will demonstrate improved FOTO to equal or greater than 69 by visit #12 to demonstrate improvement in overall condition and self-reported functional ability.  Baseline: 50 (10/23/21); Goal status: In-progress   3.  Patient will demonstrate full lumbar AROM with no increase in pain or symptoms except for intermittent end range discomfort to improve his ability to complete work activities, get in and out of the car, and get dressed in the morning.  Baseline: limited and painful - see objective exam (10/23/21); Goal status: In-progress   4.  Patient will demonstrate B LE strength equal or greater than 5/5 MMT with no increase in pain to improve his ability to work out, care for his kids, and complete lifting activities.  Baseline: some motions painful and weak, especially left ankle dorsiflexion (10/23/21); Goal status: In-progress   5.  Patient will complete community, work and/or recreational activities without limitation due to current condition.  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   PLAN FOR NEXT SESSION: update HEP as appropriate, specific exercise for directional preference as appropriate, manual therapy and dry needling as needed, progressive core/LE/functional strengthening as tolerated.      Everlean Alstrom. Graylon Good, PT, DPT 11/01/21, 12:22 PM  [facility] [address] [place], [place] [address] P: [phone] I F: [phone]

## 2021-11-01 ENCOUNTER — Ambulatory Visit: Payer: Managed Care, Other (non HMO) | Admitting: Physical Therapy

## 2021-11-01 ENCOUNTER — Encounter: Payer: Self-pay | Admitting: Physical Therapy

## 2021-11-01 DIAGNOSIS — M5459 Other low back pain: Secondary | ICD-10-CM | POA: Diagnosis not present

## 2021-11-01 DIAGNOSIS — M6281 Muscle weakness (generalized): Secondary | ICD-10-CM

## 2021-11-01 DIAGNOSIS — R262 Difficulty in walking, not elsewhere classified: Secondary | ICD-10-CM

## 2021-11-01 DIAGNOSIS — M5416 Radiculopathy, lumbar region: Secondary | ICD-10-CM

## 2021-11-02 ENCOUNTER — Telehealth: Payer: Self-pay | Admitting: Family Medicine

## 2021-11-02 NOTE — Telephone Encounter (Signed)
Pt dropped off paperwork  Type of forms received:FMLA   Routed BA:[id number]  Paperwork received by : Scheryl Marten   Individual made aware of 3-5 business day turn around (Y/N): Y Form completed and patient made aware of charges(Y/N):Y   Faxed to :   Form location:  [address]

## 2021-11-06 NOTE — Telephone Encounter (Signed)
Looks like it's a f/u form from the original FMLA. The 1st page is what PCP needs to fill out the 2nd page is just for reference. Form placed in your inbox for review

## 2021-11-06 NOTE — Therapy (Signed)
OUTPATIENT PHYSICAL THERAPY TREATMENT NOTE   Patient Name: Christopher West MRN: [id number] DOB:1981/01/05, 41 y.o., male Today's Date: 11/07/2021  PCP: Abner Greenspan, MD REFERRING PROVIDER: Abner Greenspan, MD  END OF SESSION:   PT End of Session - 11/07/21 2022     Visit Number 5    Number of Visits 24    Date for PT Re-Evaluation 01/15/22    Authorization Type [employer] reporting period from 10/23/2021    Authorization Time Period 60 per cal yr    Authorization - Visit Number 5    Authorization - Number of Visits 60    Progress Note Due on Visit 10    PT Start Time 1120    PT Stop Time 1200    PT Time Calculation (min) 40 min    Activity Tolerance Patient tolerated treatment well    Behavior During Therapy [facility] for tasks assessed/performed                History reviewed. No pertinent past medical history. History reviewed. No pertinent surgical history. 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 male who presents to outpatient physical therapy with a referral for medical diagnosis chronic left-sided low back pain with left-sided sciatica. 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 Relevant past medical history and comorbidities include ADD. Patient denies hx of cancer, stroke, seizures, lung problems, heart problems, diabetes, unexplained weight loss, unexplained changes in bowel or bladder problems, unexplained stumbling or dropping things, osteoporosis, and spinal surgery.  PRECAUTIONS: None  SUBJECTIVE: Patient reports he had a great day yesterday but it was "right back to where it was" this morning. He didn't have to get the kids up yesterday is the only difference he can think of. He report no pain currently. He continues to have the worst trouble in the mornings. When he dropped his son off at school he just sat in his car until he felt like driving again. Pain is also still waking him up at night. He is sleeping on his side more now or partially on his stomach and side. He tries to sleep on his back but it doesn't feel right. He does not have pain when he goes to bed unless he showers and shaves before bed. Pain starts to get better when he is just sitting down and taking a break or working.   PAIN:  Are you having pain? No not currently. Up to 6-7/10 pain this morning in the left calf and thigh.   OBJECTIVE  TODAY'S TREATMENT  Therapeutic  exercise: to centralize symptoms and improve ROM, strength, muscular endurance, and activity tolerance required for successful completion of functional activities.  - Treadmill 2.5 mph at 0% grade with B UE support. For improved lower extremity mobility, muscular endurance, and weightbearing activity tolerance; and to induce the analgesic effect of aerobic exercise, stimulate improved joint nutrition, and prepare body structures and systems for following interventions. x 4 minutes. Left foot slap noted.   - unable to keep L forefoot up while attempting to heel walk. Lumbar flexion ROM fingers to just distal to knees, Lumbar extension ~ 25% "feels good" - seated lumbar flexion with hands reaching for back of heels, 1x10 (increased pain in left calf each rep, lingering pain in left calf).  - heel walking feels a bit easier but still can't hold left forefoot up, lumbar flexion improved to fingers to mid shins, lumbar extension no worse.  - seated lumbar flexion with hands reaching for back of heels, 1x10 (continues to feel pain in left calf each rep, lingering pain in left calf).  - walking/standing increased pain at left low back, heel walking no change, fingers to mid shins no change, lumbar extension no change. No change in foot slap with walking.  - prone press up with yoga blocks under hands to deepen stretch, 3x10 (increasing low back pain, lightened pain in left calf).  - walking/standing slight left calf pain, no low back pain, still cannot keep left ankle dorsiflexed with heel walking but seems easier, lumbar flexion increased pain and fingers do not go as distal, lumbar extension ~ 50%. No change in foot slap on TM at 2.5 MPH.  - seated lat pull down, 3x10 at 65# (no pain).  - standing glute pull through with cable, 3x10 at 20/25/25# - half kneeling multifidus press, 2x10 each side with each leg up using BlackTB single band then loop.  - seated knee extension on OMEGA machine, 2x10 at 20# with B LE. (Rates medium difficulty)  Pt required multimodal cuing for proper technique and to facilitate improved neuromuscular control, strength, range of motion, and functional ability resulting in improved performance and form.     PATIENT EDUCATION:  Education details: Exercise purpose/form. Self management techniques. Education on diagnosis, prognosis, POC, anatomy and physiology of current condition Education on HEP including handout  Person educated: Patient Education method: Explanation,  Demonstration, Tactile cues, Verbal cues, and Handouts Education comprehension: verbalized understanding, returned demonstration, verbal cues required, tactile cues required, and needs further education     HOME EXERCISE PROGRAM: Access Code: [email or web address] URL: [email or web address] Date: 11/01/2021 Prepared by: Rosita Kea  Exercises - Seated Correct Posture  - Prone Press Up  - 8 x daily - 20 reps - 1 second hold - Standing Lumbar Extension with Counter  - 8 x daily - 1 sets - 20 reps - 1 second hold - Standing Lumbar Extension  - 8 x daily - 1 sets - 20 reps - 1 second hold - Supine 90/90 Sciatic Nerve Glide with Knee Flexion/Extension  - 2 x daily - 15 reps - Half Kneeling Anti-Rotation Press With Shoulder Flexion and Anchored Resistance  - 3-5 x weekly - 3 sets - 10 reps - 1-5 seconds hold - [place]  - 3-5 x weekly - 2-3 sets - 10 reps   ASSESSMENT:   CLINICAL IMPRESSION: Patient tolerated treatment well and was able to progress core, LE, and functional strengthening today. Patient's left foot drop appears worse today with foot slap noticeable on treadmill at 2.5  mph. Patient re-tested for directional preference and had mild increase in LE and back pain with flexion based exercises and slight lessening of calf discomfort but also lost some flexion ROM with extension exercises. Nothing changed foot slap significantly. Previously patient has appeared to respond with decreased symptoms to lumbar extension exercises. Patient continues to report difficulty with sleeping. Plan to continue with core/LE/functional strengthening next   Patient is a 41 y.o. male referred to outpatient physical therapy with a medical diagnosis of chronic left-sided low back pain with left-sided sciatica who presents with signs and symptoms consistent with subacute L sided low back pain with L4 radiculopathy. Patient has weakness at the left ankle dorsiflexors that condition is affecting the L4  nerve root. 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 Patient will benefit from skilled physical therapy intervention to address current body structure impairments and activity limitations to improve function and work towards goals set in current POC in order to return to prior level of function or maximal functional improvement.    OBJECTIVE IMPAIRMENTS decreased activity tolerance, decreased endurance, decreased knowledge of condition, decreased mobility, difficulty walking, decreased ROM, decreased strength, impaired perceived functional ability, increased muscle spasms, impaired flexibility, and pain.    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    PERSONAL FACTORS Age, Profession, Time since onset of injury/illness/exacerbation, and 1 comorbidity: ADD  are also affecting patient's functional outcome.      REHAB POTENTIAL: Good   CLINICAL DECISION MAKING: Stable/uncomplicated   EVALUATION COMPLEXITY: Low     GOALS: Goals reviewed with patient? No   SHORT TERM GOALS: Target date: 11/06/2021   Patient will be independent with initial home exercise program for self-management of symptoms. Baseline: Initial HEP provided at IE (10/23/21); Goal status: In-progress     LONG TERM GOALS: Target date: 01/15/2022   Patient will be independent with a long-term home exercise program for self-management of symptoms.  Baseline: Initial HEP provided at IE  (10/23/21); Goal status: In-progress   2.  Patient will demonstrate improved FOTO to equal or greater than 69 by visit #12 to demonstrate improvement in overall condition and self-reported functional ability.  Baseline: 50 (10/23/21); Goal status: In-progress   3.  Patient will demonstrate full lumbar AROM with no increase in pain or symptoms except for intermittent end range discomfort to improve his ability to complete work activities, get in and out of the car, and get dressed in the morning.  Baseline: limited and painful - see objective exam (10/23/21); Goal status: In-progress   4.  Patient will demonstrate B LE strength equal or greater than 5/5 MMT with no increase in pain to improve his ability to work out, care for his kids, and complete lifting activities.  Baseline: some motions painful and weak, especially left ankle dorsiflexion (10/23/21); Goal status: In-progress   5.  Patient will complete community, work and/or recreational activities without limitation due to current condition.  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   PLAN FOR NEXT SESSION: update HEP as appropriate, specific exercise for directional preference as appropriate, manual therapy and dry needling as needed, progressive core/LE/functional strengthening as tolerated.      Everlean Alstrom. Graylon Good, PT, DPT 11/07/21, 8:23 PM  [facility] [address] [place], [place] [address] P: [phone] I F: [phone]

## 2021-11-07 ENCOUNTER — Encounter: Payer: Self-pay | Admitting: Physical Therapy

## 2021-11-07 ENCOUNTER — Ambulatory Visit: Payer: Managed Care, Other (non HMO) | Admitting: Physical Therapy

## 2021-11-07 DIAGNOSIS — M5416 Radiculopathy, lumbar region: Secondary | ICD-10-CM

## 2021-11-07 DIAGNOSIS — M5459 Other low back pain: Secondary | ICD-10-CM | POA: Diagnosis not present

## 2021-11-07 DIAGNOSIS — M6281 Muscle weakness (generalized): Secondary | ICD-10-CM

## 2021-11-07 DIAGNOSIS — R262 Difficulty in walking, not elsewhere classified: Secondary | ICD-10-CM

## 2021-11-07 NOTE — Telephone Encounter (Signed)
From faxed and copy mailed per pt request and copy sent to scanning

## 2021-11-07 NOTE — Telephone Encounter (Signed)
Done and in IN box 

## 2021-11-12 ENCOUNTER — Ambulatory Visit (INDEPENDENT_AMBULATORY_CARE_PROVIDER_SITE_OTHER): Payer: Managed Care, Other (non HMO) | Admitting: Family Medicine

## 2021-11-12 ENCOUNTER — Encounter: Payer: Self-pay | Admitting: Family Medicine

## 2021-11-12 VITALS — BP 126/84 | HR 97 | Ht 67.0 in | Wt 210.4 lb

## 2021-11-12 DIAGNOSIS — M5442 Lumbago with sciatica, left side: Secondary | ICD-10-CM

## 2021-11-12 DIAGNOSIS — G8929 Other chronic pain: Secondary | ICD-10-CM | POA: Diagnosis not present

## 2021-11-12 NOTE — Assessment & Plan Note (Signed)
Low back pain with L4 radicular symptoms and L5-S1 disc degeneration notable on xray   Some mild improvement with PT so far  Did not tolerate prednisone  voltaren gel , recommended to try since does not tolerate oral nsaids well  Heat/ice  Continue PT Mild R foot slap noted-watching carefully  If not further imp in 1 mo would consider MRI LS ER precautions reviewed

## 2021-11-12 NOTE — Progress Notes (Signed)
Subjective:    Patient ID: Christopher West, male    DOB: 11-Oct-1980, 41 y.o.   MRN: [id number]  HPI Pt presents for f/u of low back/sciatic problems   Wt Readings from Last 3 Encounters:  11/12/21 210 lb 6.4 oz (95.4 kg)  10/10/21 212 lb 8 oz (96.4 kg)  04/11/21 203 lb 8 oz (92.3 kg)   32.95 kg/m  Is starting to have some better days  Not too bad at night-has to wake up and move several times but brief   Worst in am, stiff When he is in a rush- it is also bothersome  Walking is better than standing    Seen on 5/3 Acute on chronic L low back pain with rad to leg  (consistent with L4 radiculopathy)  Tx with pred taper and flexeril Side eff to prednisone - did not finish it  Ref to PT   Does not like nsaids - heartburn   We did FLMA    LS XR from 10/10/21 CLINICAL DATA:  Left low back pain with sciatica.   EXAM: LUMBAR SPINE - COMPLETE 4+ VIEW   COMPARISON:  No recent prior.   FINDINGS: Lumbar spine numbered the lowest segmented appearing lumbar shaped vertebrae on lateral view as L5. Mild L5-S1 disc degeneration. No acute or focal bony abnormality identified. Pelvic calcifications consistent phleboliths. Soft tissues are unremarkable.   IMPRESSION: Mild L5-S1 disc degeneration, otherwise negative exam. No acute bony abnormality identified.   In PT   at last visit he had  L foot slap  Patient Active Problem List   Diagnosis Date Noted   Low back pain 10/10/2021   Current non-smoker 04/11/2021   ADD (attention deficit disorder) 04/11/2021   Stress reaction 08/04/2017   Skin lesion of back 08/04/2017   Routine general medical examination at a health care facility 12/07/2014   LIPOMA 01/18/2010   VENEREAL WART 05/27/2007   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 No pertinent past medical history. History reviewed. No pertinent surgical history. Social History   Tobacco Use   Smoking status: Never   Smokeless tobacco:  Never  Vaping Use   Vaping Use: Never used  Substance Use Topics   Alcohol use: Yes    Alcohol/week: 0.0 standard drinks    Comment: Rare   Drug use: Yes    Types: Marijuana    Comment: occ   History reviewed. No pertinent family history. Allergies  Allergen Reactions   Ativan [Lorazepam] Anxiety   Current Outpatient Medications on File Prior to Visit  Medication Sig Dispense Refill   VYVANSE 40 MG capsule Take 40 mg by mouth daily.     cyclobenzaprine (FLEXERIL) 10 MG tablet Take 10 mg by mouth 3 (three) times daily as needed. (Patient not taking: Reported on 11/12/2021)     predniSONE (DELTASONE) 10 MG tablet Take 4 pills once daily by mouth for 3 days, then 3 pills daily for 3 days, then 2 pills daily for 3 days then 1 pill daily for 3 days then stop (Patient not taking: Reported on 11/12/2021) 30 tablet 0   No current facility-administered medications on file prior to visit.    Review of Systems  Constitutional:  Negative for activity change, appetite change, fatigue, fever and unexpected weight change.  HENT:  Negative for congestion, rhinorrhea, sore throat and trouble swallowing.   Eyes:  Negative for pain, redness, itching and visual disturbance.  Respiratory:  Negative for cough, chest tightness, shortness  of breath and wheezing.   Cardiovascular:  Negative for chest pain and palpitations.  Gastrointestinal:  Negative for abdominal pain, blood in stool, constipation, diarrhea and nausea.  Endocrine: Negative for cold intolerance, heat intolerance, polydipsia and polyuria.  Genitourinary:  Negative for difficulty urinating, dysuria, frequency and urgency.  Musculoskeletal:  Positive for back pain. Negative for arthralgias, joint swelling and myalgias.  Skin:  Negative for pallor and rash.  Neurological:  Negative for dizziness, tremors, weakness, numbness and headaches.  Hematological:  Negative for adenopathy. Does not bruise/bleed easily.  Psychiatric/Behavioral:  Negative  for decreased concentration and dysphoric mood. The patient is not nervous/anxious.       Objective:   Physical Exam Constitutional:      General: He is not in acute distress.    Appearance: Normal appearance. He is obese. He is not ill-appearing or diaphoretic.  Cardiovascular:     Rate and Rhythm: Regular rhythm.     Heart sounds: Normal heart sounds.  Pulmonary:     Effort: Pulmonary effort is normal. No respiratory distress.     Breath sounds: Normal breath sounds.  Musculoskeletal:     Lumbar back: Tenderness present. No swelling, edema, deformity or bony tenderness. Normal range of motion. Positive right straight leg raise test. Negative left straight leg raise test. No scoliosis.     Comments: Discomfort with L lateral bend and flex past 90 deg     Skin:    Coloration: Skin is not pale.     Findings: No erythema or rash.  Neurological:     Mental Status: He is alert.     Sensory: No sensory deficit.     Coordination: Coordination normal.     Deep Tendon Reflexes: Reflexes normal.     Comments: Mild right foot slap when walking on heels today    Psychiatric:        Mood and Affect: Mood normal.          Assessment & Plan:   Problem List Items Addressed This Visit       Other   Low back pain - Primary    Low back pain with L4 radicular symptoms and L5-S1 disc degeneration notable on xray   Some mild improvement with PT so far  Did not tolerate prednisone  voltaren gel , recommended to try since does not tolerate oral nsaids well  Heat/ice  Continue PT Mild R foot slap noted-watching carefully  If not further imp in 1 mo would consider MRI LS ER precautions reviewed

## 2021-11-12 NOTE — Patient Instructions (Addendum)
Try voltaren gel over the counter 1%  Use it topically as needed to affected area   Tylenol may help pain if needed   If you take [facility]- it can bother stomach/use if with food and carefully    Keep doing the PT   If no more progress in 1 month- let us know  Would consider MRI

## 2021-11-13 ENCOUNTER — Encounter: Payer: Self-pay | Admitting: Physical Therapy

## 2021-11-13 ENCOUNTER — Ambulatory Visit: Payer: Managed Care, Other (non HMO) | Attending: Family Medicine | Admitting: Physical Therapy

## 2021-11-13 DIAGNOSIS — M5459 Other low back pain: Secondary | ICD-10-CM | POA: Insufficient documentation

## 2021-11-13 DIAGNOSIS — M5416 Radiculopathy, lumbar region: Secondary | ICD-10-CM | POA: Insufficient documentation

## 2021-11-13 DIAGNOSIS — R262 Difficulty in walking, not elsewhere classified: Secondary | ICD-10-CM | POA: Diagnosis present

## 2021-11-13 DIAGNOSIS — M6281 Muscle weakness (generalized): Secondary | ICD-10-CM | POA: Diagnosis present

## 2021-11-13 NOTE — Therapy (Signed)
OUTPATIENT PHYSICAL THERAPY TREATMENT NOTE   Patient Name: Christopher West MRN: [id number] DOB:10/02/1980, 41 y.o., male Today's Date: 11/13/2021  PCP: Abner Greenspan, MD REFERRING PROVIDER: Abner Greenspan, MD  END OF SESSION:   PT End of Session - 11/13/21 1425     Visit Number 6    Number of Visits 24    Date for PT Re-Evaluation 01/15/22    Authorization Type [employer] reporting period from 10/23/2021    Authorization Time Period 60 per cal yr    Authorization - Visit Number 6    Authorization - Number of Visits 60    Progress Note Due on Visit 10    PT Start Time 4098    PT Stop Time 1427    PT Time Calculation (min) 28 min    Activity Tolerance Patient tolerated treatment well;No increased pain    Behavior During Therapy [facility] for tasks assessed/performed                 History reviewed. No pertinent past medical history. History reviewed. No pertinent surgical history. 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 male who presents to outpatient physical therapy with a referral for medical diagnosis chronic left-sided low back pain with left-sided sciatica. 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 Relevant past medical history and comorbidities include ADD. Patient denies hx of cancer, stroke, seizures, lung problems, heart problems, diabetes, unexplained weight loss, unexplained changes in bowel or bladder problems, unexplained stumbling or dropping things, osteoporosis, and spinal surgery.  PRECAUTIONS: None  SUBJECTIVE: Patient reports his back is feeling okay. His back has not been as bad and his mornings have been a little better. His back has still bothered him some. He went out with some friends and he was walking in the parking lot and the bottom of his left leg and calf was hurting. It suddenly came on and then faded away as he went home. He was not sore after last PT session. Press up or lumbar extension exercises tends to be his go to exercise.   PAIN:  Are you having pain? Yes. 3-4/10 left calf.   OBJECTIVE  SELF-REPORTED FUNCTION FOTO score: 44/100 (lumbar spine questionnaire)  TODAY'S TREATMENT  Therapeutic exercise: to centralize symptoms and improve ROM, strength, muscular endurance, and activity tolerance required for successful completion of functional activities.  - prone press up with yoga blocks under hands to deepen stretch, 2x10  - prone press up with yoga blocks under hands and clinician overpressure at base of lumbar spine, 2x10. (  Improved pain).      Circuit:  - seated lat pull down, 3x10 at 65# (no pain).  - standing glute pull through with cable, 3x10 at 25# - modified push up, 3x10 at TM bar  - standing marching in multifidus press position, 3x10 using BlackTB single band.   - prone press up with yoga blocks under hands and clinician overpressure at base of lumbar spine, 2x10. (Pain abolihsed).  -  Education on HEP including handout   Pt required multimodal cuing for proper technique and to facilitate improved neuromuscular control, strength, range of motion, and functional ability resulting in improved performance and form.     PATIENT EDUCATION:  Education details: Exercise purpose/form. Self management techniques.  Person educated: Patient Education method: Explanation, Demonstration, Tactile cues, Verbal cues,  Education comprehension: verbalized understanding, returned demonstration, verbal cues required, tactile cues required, and needs further education     HOME EXERCISE PROGRAM: Access Code: [email or web address] URL: [email or web address] Date: 11/13/2021 Prepared by: Rosita Kea  Exercises - Seated Correct Posture  - Prone Press Up  - 8 x daily - 20 reps - 1 second hold - Standing Lumbar Extension with Counter  - 8 x daily - 1 sets - 20 reps - 1 second hold - Standing Lumbar Extension  - 8 x daily - 1 sets - 20 reps - 1 second hold - Supine 90/90 Sciatic Nerve Glide with Knee Flexion/Extension  - 2 x daily - 15 reps - Marching Bridge  - 3-5 x weekly - 2-3 sets - 10 reps - Isometric Anti-Rotation Press with marching  - 3-5 x weekly - 3 sets - 10 reps   ASSESSMENT:   CLINICAL IMPRESSION: Patient was 13 min late today and session was shortened accordingly. Patient continues to respond well but incompletely to extension exercises with pain abolished by prone press up with clinician overpressure by end of session. Functional and core strengthening progressed today and HEP updated accordingly. Plan to continue working towards improved pain control and functional activity tolerance next session. Patient would benefit from continued management of limiting condition by skilled physical therapist to address remaining impairments and functional limitations to work towards stated goals and return to PLOF or maximal functional independence.   Patient is a 41 y.o. male referred to  outpatient physical therapy with a medical diagnosis of chronic left-sided low back pain with left-sided sciatica who presents with signs and symptoms consistent with subacute L sided low back pain with L4 radiculopathy. Patient has weakness at the left ankle dorsiflexors that condition is affecting the L4 nerve root. 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 Patient will benefit from skilled physical therapy intervention to address current body structure impairments and activity limitations to improve function and work towards goals set in current POC in order to return to prior level of function or maximal functional improvement.    OBJECTIVE IMPAIRMENTS decreased activity tolerance, decreased endurance, decreased knowledge of condition, decreased mobility, difficulty walking, decreased ROM, decreased strength, impaired perceived functional ability, increased muscle spasms, impaired flexibility, and pain.    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    PERSONAL FACTORS Age, Profession, Time since onset of injury/illness/exacerbation, and 1 comorbidity: ADD  are also affecting patient's functional outcome.      REHAB POTENTIAL: Good   CLINICAL DECISION MAKING: Stable/uncomplicated   EVALUATION COMPLEXITY: Low     GOALS: Goals reviewed with patient? No   SHORT TERM GOALS: Target date: 11/06/2021   Patient will be independent with initial home exercise program for  self-management of symptoms. Baseline: Initial HEP provided at IE (10/23/21); Goal status: MET     LONG TERM GOALS: Target date: 01/15/2022   Patient will be independent with a long-term home exercise program for self-management of symptoms.  Baseline: Initial HEP provided at IE (10/23/21); Goal status: In-progress   2.  Patient will demonstrate improved FOTO to equal or greater than 69 by visit #12 to demonstrate improvement in overall condition and self-reported functional ability.  Baseline: 50 (10/23/21); 44 (11/13/2021);  Goal status: In-progress   3.  Patient will demonstrate full lumbar AROM with no increase in pain or symptoms except for intermittent end range discomfort to improve his ability to complete work activities, get in and out of the car, and get dressed in the morning.  Baseline: limited and painful - see objective exam (10/23/21); Goal status: In-progress   4.  Patient will demonstrate B LE strength equal or greater than 5/5 MMT with no increase in pain to improve his ability to work out, care for his kids, and complete lifting activities.  Baseline: some motions painful and weak, especially left ankle dorsiflexion (10/23/21); Goal status: In-progress   5.  Patient will complete community, work and/or recreational activities without limitation due to current condition.  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   PLAN FOR  NEXT SESSION: update HEP as appropriate, specific exercise for directional preference as appropriate, manual therapy and dry needling as needed, progressive core/LE/functional strengthening as tolerated.      Everlean Alstrom. Graylon Good, PT, DPT 11/13/21, 2:39 PM  [facility] [address] [place], [place] [address] P: [phone] I F: [phone]

## 2021-11-19 ENCOUNTER — Encounter: Payer: Managed Care, Other (non HMO) | Admitting: Physical Therapy

## 2021-11-21 ENCOUNTER — Telehealth: Payer: Self-pay | Admitting: Physical Therapy

## 2021-11-21 ENCOUNTER — Ambulatory Visit: Payer: Managed Care, Other (non HMO) | Admitting: Physical Therapy

## 2021-11-21 NOTE — Telephone Encounter (Signed)
Made two attempts to call patient after he did not come to his appointment scheduled at 9:45am today. Phone connected both times and caller could hear people talking and a rustling noise but no one answered attempts to speak made by caller. Call unsuccessful.   Everlean Alstrom. Graylon Good, PT, DPT 11/21/21, 10:12 AM  [facility] [address] [place], [place] [address] P: [phone] I F: [phone]

## 2021-11-22 ENCOUNTER — Ambulatory Visit: Payer: Managed Care, Other (non HMO) | Admitting: Physical Therapy

## 2021-11-22 ENCOUNTER — Encounter: Payer: Self-pay | Admitting: Physical Therapy

## 2021-11-22 DIAGNOSIS — M5459 Other low back pain: Secondary | ICD-10-CM | POA: Diagnosis not present

## 2021-11-22 DIAGNOSIS — M5416 Radiculopathy, lumbar region: Secondary | ICD-10-CM

## 2021-11-22 DIAGNOSIS — R262 Difficulty in walking, not elsewhere classified: Secondary | ICD-10-CM

## 2021-11-22 DIAGNOSIS — M6281 Muscle weakness (generalized): Secondary | ICD-10-CM

## 2021-11-22 NOTE — Therapy (Signed)
OUTPATIENT PHYSICAL THERAPY TREATMENT NOTE   Patient Name: Christopher West MRN: [id number] DOB:Nov 04, 1980, 41 y.o., male Today's Date: 11/22/2021  PCP: Abner Greenspan, MD REFERRING PROVIDER: Abner Greenspan, MD  END OF SESSION:   PT End of Session - 11/22/21 0958     Visit Number 7    Number of Visits 24    Date for PT Re-Evaluation 01/15/22    Authorization Type [employer] reporting period from 10/23/2021    Authorization Time Period 60 per cal yr    Authorization - Visit Number 7    Authorization - Number of Visits 60    Progress Note Due on Visit 10    PT Start Time 0956    PT Stop Time 1025    PT Time Calculation (min) 29 min    Activity Tolerance Patient tolerated treatment well;No increased pain    Behavior During Therapy [facility] for tasks assessed/performed                  History reviewed. No pertinent past medical history. History reviewed. No pertinent surgical history. 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 male who presents to outpatient physical therapy with a referral for medical diagnosis chronic left-sided low back pain with left-sided sciatica. 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 Relevant past medical history and comorbidities include ADD. Patient denies hx of cancer, stroke, seizures, lung problems, heart problems, diabetes, unexplained weight loss, unexplained changes in bowel or bladder problems, unexplained stumbling or dropping things, osteoporosis, and spinal surgery.  PRECAUTIONS: None  SUBJECTIVE: Patient reports he is feeling okay and his back is feeling a little better. He walked all around [place] and seemed to only have pain when he was stressed out at the end of the day when leaving and when going out to the car to get lunch. He did ride on some rides and that was okay. He currently has no pain. He has been doing his HEP very often, especially when he feels something coming on. He continues to have difficulty after he sleeps including in the bed or when he slept in the car. He was not sore after his last PT session.   PAIN:  Are you having pain? no  OBJECTIVE  TODAY'S TREATMENT  Therapeutic exercise: to centralize symptoms and improve ROM, strength, muscular endurance, and activity tolerance required for successful completion of functional activities.  - Treadmill up to 2.7 mph at 0 grade with B UE support. For improved lower extremity mobility, muscular endurance, and weightbearing activity tolerance;  and to induce the analgesic effect of aerobic exercise, stimulate improved joint nutrition, and prepare body structures and systems for following interventions. x 5  minutes.  - prone press up with yoga blocks under hands to deepen stretch, 2x10.      Circuit:  - seated lat pull down, 3x10 at 75#.  (Rated medium difficulty) - standing glute pull through with cable, 3x10 at 35/45/55# (rated medium - hard  difficulty) - modified push up, 3x10 at TM bar (rated medium difficulty) - standing marching in multifidus press position, 3x15 using BlackTB single band. (Can feel in left calf).  - prone press up with yoga blocks under hands and clinician overpressure at base of lumbar spine, 2x10. (Pain abolihsed). 1x10 with towel overpressure to illustrate how to perform at home.   Pt required multimodal cuing for proper technique and to facilitate improved neuromuscular control, strength, range of motion, and functional ability resulting in improved performance and form.     PATIENT EDUCATION:  Education details: Exercise purpose/form. Self management techniques.  Reviewed cancelation/no-show policy with patient and confirmed patient has correct phone number for clinic; patient verbalized understanding (11/22/21). Person educated: Patient Education method: Explanation, Demonstration, Tactile cues, Verbal cues,  Education comprehension: verbalized understanding, returned demonstration, verbal cues required, tactile cues required, and needs further education     HOME EXERCISE PROGRAM: Access Code: [email or web address] URL: [email or web address] Date: 11/13/2021 Prepared by: Rosita Kea  Exercises - Seated Correct Posture  - Prone Press Up  - 8 x daily - 20 reps - 1 second hold - Standing Lumbar Extension with Counter  - 8 x daily - 1 sets - 20 reps - 1 second hold - Standing Lumbar Extension  - 8 x daily - 1 sets - 20 reps - 1 second hold - Supine 90/90 Sciatic Nerve Glide with Knee Flexion/Extension  - 2 x daily - 15 reps - Marching Bridge  - 3-5 x weekly - 2-3 sets - 10 reps - Isometric Anti-Rotation Press with marching  - 3-5 x weekly - 3 sets - 10 reps   ASSESSMENT:   CLINICAL IMPRESSION: Patient was 10 min late and treatment time adjusted accordingly. Patient appears to be doing better and was able to complete all pushing and pulling exercises with no increase in pain. He did start to get  some left calf pain with pallof marching exercise that resolved with prone press up after. Plan to continue with core, LE, and functional strengthening with exercises for directional preference as needed to control pain. Patient would benefit from continued management of limiting condition by skilled physical therapist to address remaining impairments and functional limitations to work towards stated goals and return to PLOF or maximal functional independence.   Patient is a 41 y.o. male referred to outpatient physical therapy with a medical diagnosis of chronic left-sided low back pain with left-sided sciatica who presents with signs and symptoms consistent with subacute L sided low back pain with L4 radiculopathy. Patient has weakness at the left ankle dorsiflexors that condition is affecting the L4 nerve root. 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 Patient will benefit from skilled physical therapy intervention to address current body structure impairments and activity limitations to improve function and work towards goals set in current  POC in order to return to prior level of function or maximal functional improvement.    OBJECTIVE IMPAIRMENTS decreased activity tolerance, decreased endurance, decreased knowledge of condition, decreased mobility, difficulty walking, decreased ROM, decreased strength, impaired perceived functional ability, increased muscle spasms, impaired flexibility, and pain.    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    PERSONAL FACTORS Age, Profession, Time since onset of injury/illness/exacerbation, and 1 comorbidity: ADD  are also affecting patient's functional outcome.      REHAB POTENTIAL: Good   CLINICAL DECISION MAKING: Stable/uncomplicated   EVALUATION COMPLEXITY: Low     GOALS: Goals reviewed with patient? No   SHORT TERM GOALS: Target date: 11/06/2021   Patient will be independent with initial home exercise program for self-management of symptoms. Baseline: Initial HEP provided at IE (10/23/21); Goal status: MET     LONG TERM GOALS: Target date: 01/15/2022   Patient will be independent with a long-term home exercise program for self-management of symptoms.  Baseline: Initial HEP provided at IE (10/23/21); Goal status: In-progress   2.  Patient will demonstrate improved FOTO to equal or greater than 69 by visit #12 to demonstrate improvement in overall condition and self-reported functional ability.  Baseline: 50 (10/23/21); 44 (11/13/2021);  Goal status: In-progress   3.  Patient will demonstrate full lumbar AROM with no increase in pain or symptoms except for intermittent end range discomfort to improve his ability to complete work activities, get in and out of the car, and get dressed in the morning.  Baseline: limited and painful - see objective exam (10/23/21); Goal status: In-progress   4.  Patient will demonstrate B LE strength equal or greater than 5/5 MMT with no increase in pain to improve his ability to work out, care for his kids, and complete lifting activities.  Baseline: some motions painful and weak, especially left ankle dorsiflexion (10/23/21); Goal status: In-progress   5.  Patient will complete community, work and/or recreational activities without limitation due to current condition.  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   PLAN FOR NEXT SESSION: update HEP as appropriate, specific exercise for directional preference as appropriate, manual therapy and dry needling as needed, progressive core/LE/functional strengthening as tolerated.      Everlean Alstrom. Graylon Good, PT, DPT 11/22/21, 10:29 AM  [facility] [address] [place], [place] [address] P: [phone] I F: [phone]

## 2021-11-27 ENCOUNTER — Encounter: Payer: Self-pay | Admitting: Physical Therapy

## 2021-11-27 ENCOUNTER — Ambulatory Visit: Payer: Managed Care, Other (non HMO) | Admitting: Physical Therapy

## 2021-11-27 DIAGNOSIS — M5459 Other low back pain: Secondary | ICD-10-CM

## 2021-11-27 DIAGNOSIS — M5416 Radiculopathy, lumbar region: Secondary | ICD-10-CM

## 2021-11-27 DIAGNOSIS — R262 Difficulty in walking, not elsewhere classified: Secondary | ICD-10-CM

## 2021-11-27 DIAGNOSIS — M6281 Muscle weakness (generalized): Secondary | ICD-10-CM

## 2021-11-27 NOTE — Therapy (Signed)
OUTPATIENT PHYSICAL THERAPY TREATMENT NOTE   Patient Name: Christopher West MRN: [id number] DOB:February 14, 1981, 41 y.o., male Today's Date: 11/27/2021  PCP: Abner Greenspan, MD REFERRING PROVIDER: Abner Greenspan, MD  END OF SESSION:   PT End of Session - 11/27/21 1357     Visit Number 8    Number of Visits 24    Date for PT Re-Evaluation 01/15/22    Authorization Type [employer] reporting period from 10/23/2021    Authorization Time Period 60 per cal yr    Authorization - Visit Number 8    Authorization - Number of Visits 60    Progress Note Due on Visit 10    PT Start Time 1355    PT Stop Time 1433    PT Time Calculation (min) 38 min    Activity Tolerance Patient tolerated treatment well;No increased pain    Behavior During Therapy [facility] for tasks assessed/performed                   History reviewed. No pertinent past medical history. History reviewed. No pertinent surgical history. 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 male who presents to outpatient physical therapy with a referral for medical diagnosis chronic left-sided low back pain with left-sided sciatica. 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 Relevant past medical history and comorbidities include ADD. Patient denies hx of cancer, stroke, seizures, lung problems, heart problems, diabetes, unexplained weight loss, unexplained changes in bowel or bladder problems, unexplained stumbling or dropping things, osteoporosis, and spinal surgery.  PRECAUTIONS: None  SUBJECTIVE: Patient reports his back is getting better. He states he felt okay after last PT session. He has been sleeping a lot. His work load ebbs and flows during the year and it is slow right now. He has slight pain in the left heel. He states most of the time he has pain when he stressed or getting his daughter up and ready when he needs to get out of the door. He has no problems when he is relaxed.   PAIN:  Are you having pain? 1/10 in left heel  OBJECTIVE  TODAY'S TREATMENT  Therapeutic exercise: to centralize symptoms and improve ROM, strength, muscular endurance, and activity tolerance required for successful completion of functional activities.  - prone press up with yoga blocks under hands 2x10, with clinician OP 1x10     Circuit:  - seated lat pull down, 3x10 at 75/85/95#.  (Rated medium-hard  difficulty) - standing glute pull through with cable, 3x10 at 55/65/75# (rated medium -  hard difficulty) - modified push up on high plinth, 3x10 at TM bar (rated medium difficulty)  - standing figure 8 swings with ball in pillow case, 3x30 seconds with 2kg/3kg/3kg ball. (Increased left low back pain after 2nd set so stopped and did prone press ups - below- before 3rd set).  - prone press up with yoga blocks under hands with clinician OP, 2x10.  - standing marching in multifidus press position, 1x15 each side using BlackTB  single band. (Can feel in left calf).  - stride standing lumbar/hip rotation holding band in pallof position, 1x10 each side with each foot front, 1x10 each foot front with anchor to the right, SilverTB. (Increasing L LE pain so discontinued).   - prone press up with yoga blocks under hands with clinician OP, 1x10 (unable to get full range due to UE fatigue).  - prone press up with yoga blocks under hands, 2x10 (significant improvement in leg pain).   Pt required multimodal cuing for proper technique and to facilitate improved neuromuscular control, strength, range of motion, and functional ability resulting in improved performance and form.     PATIENT EDUCATION:  Education details: Exercise purpose/form. Self management techniques.  Reviewed cancelation/no-show policy with patient and confirmed patient has correct phone number for clinic; patient verbalized understanding (11/22/21). Person educated: Patient Education method: Explanation, Demonstration, Tactile cues, Verbal cues,  Education comprehension: verbalized understanding, returned demonstration, verbal cues required, tactile cues required, and needs further education     HOME EXERCISE PROGRAM: Access Code: [email or web address] URL: [email or web address] Date: 11/13/2021 Prepared by: Rosita Kea  Exercises - Seated Correct Posture  - Prone Press Up  - 8 x daily - 20 reps - 1 second hold - Standing Lumbar Extension with Counter  - 8 x daily - 1 sets - 20 reps - 1 second hold - Standing Lumbar Extension  - 8 x daily - 1 sets - 20 reps - 1 second hold - Supine 90/90 Sciatic Nerve Glide with Knee Flexion/Extension  - 2 x daily - 15 reps - Marching Bridge  - 3-5 x weekly - 2-3 sets - 10 reps - Isometric Anti-Rotation Press with marching  - 3-5 x weekly - 3 sets - 10 reps   ASSESSMENT:   CLINICAL IMPRESSION: Patient on time today. Patient tolerated treatment well with no increase in pain by end of session. He was able to  tolerate increased exercise volume but progressions in rotational loading caused return of leg symptoms. Continued using lumbar extension exercises to control pain that occurred with progressive dynamic loading. Patient would benefit from continued management of limiting condition by skilled physical therapist to address remaining impairments and functional limitations to work towards stated goals and return to PLOF or maximal functional independence.   Patient is a 41 y.o. male referred to outpatient physical therapy with a medical diagnosis of chronic left-sided low back pain with left-sided sciatica who presents with signs and symptoms consistent with subacute L sided low back pain with L4 radiculopathy. Patient has weakness at the left ankle dorsiflexors that condition is affecting the L4 nerve root. 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 Patient will benefit from skilled physical therapy intervention to address current body structure impairments and activity limitations to improve function and work  towards goals set in current POC in order to return to prior level of function or maximal functional improvement.    OBJECTIVE IMPAIRMENTS decreased activity tolerance, decreased endurance, decreased knowledge of condition, decreased mobility, difficulty walking, decreased ROM, decreased strength, impaired perceived functional ability, increased muscle spasms, impaired flexibility, and pain.    ACTIVITY LIMITATIONS community activity, driving, occupation, yard work, and   dressing, mowing the lawn, getting in and out of car, bending lifting, working out, working, Haematologist for  kids, Statistician, prolonged sitting, twisting.    PERSONAL FACTORS Age, Profession, Time since onset of injury/illness/exacerbation, and 1 comorbidity: ADD  are also affecting patient's functional outcome.      REHAB POTENTIAL: Good   CLINICAL DECISION MAKING: Stable/uncomplicated   EVALUATION COMPLEXITY: Low     GOALS: Goals reviewed with patient? No   SHORT TERM GOALS: Target date: 11/06/2021   Patient will be independent with initial home exercise program for self-management of symptoms. Baseline: Initial HEP provided at IE (10/23/21); Goal status: MET     LONG TERM GOALS: Target date: 01/15/2022   Patient will be independent with a long-term home exercise program for self-management of symptoms.  Baseline: Initial HEP provided at IE (10/23/21); Goal status: In-progress   2.  Patient will demonstrate improved FOTO to equal or greater than 69 by visit #12 to demonstrate improvement in overall condition and self-reported functional ability.  Baseline: 50 (10/23/21); 44 (11/13/2021);  Goal status: In-progress   3.  Patient will demonstrate full lumbar AROM with no increase in pain or symptoms except for intermittent end range discomfort to improve his ability to complete work activities, get in and out of the car, and get dressed in the morning.  Baseline: limited and painful - see objective exam (10/23/21); Goal status: In-progress   4.  Patient will demonstrate B LE strength equal or greater than 5/5 MMT with no increase in pain to improve his ability to work out, care for his kids, and complete lifting activities.  Baseline: some motions painful and weak, especially left ankle dorsiflexion (10/23/21); Goal status: In-progress   5.  Patient will complete community, work and/or recreational activities without limitation due to current condition.  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   PLAN FOR NEXT SESSION: update HEP as appropriate, specific exercise for directional preference as appropriate, manual therapy and dry needling as needed, progressive core/LE/functional strengthening as tolerated.      Everlean Alstrom. Graylon Good, PT, DPT 11/27/21, 2:35 PM  [facility] [address] [place], [place] [address] P: [phone] I F: [phone]

## 2021-11-29 ENCOUNTER — Encounter: Payer: Self-pay | Admitting: Physical Therapy

## 2021-11-29 ENCOUNTER — Ambulatory Visit: Payer: Managed Care, Other (non HMO) | Admitting: Physical Therapy

## 2021-11-29 DIAGNOSIS — M5416 Radiculopathy, lumbar region: Secondary | ICD-10-CM

## 2021-11-29 DIAGNOSIS — R262 Difficulty in walking, not elsewhere classified: Secondary | ICD-10-CM

## 2021-11-29 DIAGNOSIS — M6281 Muscle weakness (generalized): Secondary | ICD-10-CM

## 2021-11-29 DIAGNOSIS — M5459 Other low back pain: Secondary | ICD-10-CM | POA: Diagnosis not present

## 2021-11-29 NOTE — Therapy (Signed)
OUTPATIENT PHYSICAL THERAPY TREATMENT / PROGRESS NOTE Dates of reporting from 10/23/2021 to 11/29/2021   Patient Name: Christopher West MRN: [id number] DOB:October 01, 1980, 41 y.o., male Today's Date: 11/29/2021  PCP: Abner Greenspan, MD REFERRING PROVIDER: Abner Greenspan, MD  END OF SESSION:   PT End of Session - 11/29/21 1438     Visit Number 9    Number of Visits 24    Date for PT Re-Evaluation 01/15/22    Authorization Type [employer] reporting period from 10/23/2021    Authorization Time Period 60 per cal yr    Authorization - Visit Number 9    Authorization - Number of Visits 60    Progress Note Due on Visit 10    PT Start Time 1435    PT Stop Time 1515    PT Time Calculation (min) 40 min    Activity Tolerance Patient tolerated treatment well;No increased pain    Behavior During Therapy [facility] for tasks assessed/performed               History reviewed. No pertinent past medical history. History reviewed. No pertinent surgical history. 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 male who presents to outpatient physical therapy with a referral for medical diagnosis chronic left-sided low back pain with left-sided sciatica. 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 Relevant past medical history and comorbidities include ADD. Patient denies hx of cancer, stroke, seizures, lung problems, heart problems, diabetes, unexplained weight loss, unexplained changes in bowel or bladder problems, unexplained stumbling or dropping things, osteoporosis, and spinal surgery.  PRECAUTIONS: None  SUBJECTIVE: Patient reports he has not done much today and his pain has been pretty controlled. He didn't have any pain this morning but did have some last night. He did have some pain in his left leg last night and still has this periodically. He is unsure if he wants to continue coming to PT and it depends on how expensive it is for him.   PAIN:  Are you having pain? 1/10 in left heel  OBJECTIVE  SELF-REPORTED FUNCTION FOTO score: 67/100 (lumbar spine questionnaire)   SPINE MOTION LUMBAR SPINE AROM *Indicates pain Flexion: fingers to mid shin, no pain (about normal range for him) Extension: 75% Side Flexion:        R WFL       L WFL Rotation:  R WFL L WFL  MUSCLE PERFORMANCE (MMT):  *Indicates pain 10/23/21 11/29/21 Date  Joint/Motion R/L R/L R/L  Hip        Flexion (L1,  L2) 5/5 5/4+ /  Extension (knee ext) 5/4+* 5/4+ /  Abduction 5/5 5/4+ /  Knee        Extension (L3) 5/5 5/4+ /  Flexion (S2) 5/5 5/4+ /  Ankle/Foot        Dorsiflexion (L4) 5/4 5/4 /  Great toe extension (L5) 5/4+* 5/5* /  Eversion (S1) 5/5 5/5 /  Plantarflexion (S1) 5/5 5/5 /  Comments:  11/29/21: left side some fluctuation in resistance as in neurologic weakness on weak motions except L ankle DF which is consistently weak. Unable to heel walk on L LE.     TODAY'S TREATMENT   Therapeutic exercise: to centralize symptoms and improve ROM, strength, muscular endurance, and activity tolerance required for successful completion of functional activities.  - Treadmill 2.5 mph at 0% grade with B UE support. For improved lower extremity mobility, muscular endurance, and weightbearing activity tolerance; and to induce the analgesic effect of aerobic exercise, stimulate improved joint nutrition, and prepare body structures and systems for following interventions. x 5  minutes. - measurements to assess progress (see above) - hooklying bridge with marching, 2x10 each side.  - prone press up with yoga blocks under hands, 1x10 - prone press up with yoga blocks under hands 2x10, with clinician OP 1x10 - standing marching in multifidus press position, 3x10 each side using BlackTB single band. - hooklying bridge with hip abduction against black theraband, 1x20 with 5 second hold.   Pt required multimodal cuing for proper technique and to facilitate improved neuromuscular control, strength, range of motion, and functional ability resulting in improved performance and form.     PATIENT EDUCATION:  Education details: Exercise purpose/form. Self management techniques.  Reviewed cancelation/no-show policy with patient and confirmed patient has correct phone number for clinic; patient verbalized understanding (11/22/21). Person educated: Patient Education method: Explanation, Demonstration, Tactile cues, Verbal cues,  Education comprehension: verbalized understanding, returned demonstration, verbal cues required, tactile cues required, and needs further education     HOME EXERCISE PROGRAM: Access Code: [email or web address] URL: [email or web address] Date: 11/29/2021 Prepared by: Rosita Kea  Exercises - Seated Correct Posture  - Prone Press Up  - 8 x daily - 20 reps - 1 second hold - Standing Lumbar Extension with Counter  - 8 x daily - 1 sets - 20 reps - 1 second hold -  Standing Lumbar Extension  - 8 x daily - 1 sets - 20 reps - 1 second hold - Supine 90/90 Sciatic Nerve Glide with Knee Flexion/Extension  - 2 x daily - 15 reps - Isometric Anti-Rotation Press  - 3-5 x weekly - 3 sets - 10 reps - Bridge with Hip Abduction and Resistance - Ground Touches  - 1 x daily - 1 sets - 20 reps - 5 seconds hold   ASSESSMENT:   CLINICAL IMPRESSION: Patient has attended 9 physical therapy sessions since starting current episode of care on 10/23/2021. Patient has made progress towards most goals including HEP participation, lumbar AORM, FOTO (self-reported function questionnaire) score, and report he feels 70% improved since starting PT. He is much better able to control his pain and participate in his desired activities. However, he continues to have weakness in the left LE that strongly suggests neurogenic weakness, most prominent at L4 myotome. Patient demonstrates extension preference but has not yet managed to eliminate symptoms or signs. He would benefit from continued PT to continue his recovery of function and work towards restoration of L LE strength (and abolishment of symptoms there), but he  is considering discharge to independent management with return only if his symptoms fail to improve or worsen. Patient plans to notify PT what his decision is concerning continued PT prior to continuing. Patient would benefit from continued management of limiting condition by skilled physical therapist to address remaining impairments and functional limitations to work towards stated goals and return to PLOF or maximal functional independence.   Patient is a 41 y.o. male referred to outpatient physical therapy with a medical diagnosis of chronic left-sided low back pain with left-sided sciatica who presents with signs and symptoms consistent with subacute L sided low back pain with L4 radiculopathy. Patient has weakness at the left ankle dorsiflexors that condition is affecting the L4  nerve root. 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 Patient will benefit from skilled physical therapy intervention to address current body structure impairments and activity limitations to improve function and work towards goals set in current POC in order to return to prior level of function or maximal functional improvement.    OBJECTIVE IMPAIRMENTS decreased activity tolerance, decreased endurance, decreased knowledge of condition, decreased mobility, difficulty walking, decreased ROM, decreased strength, impaired perceived functional ability, increased muscle spasms, impaired flexibility, and pain.    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    PERSONAL FACTORS Age, Profession, Time since onset of injury/illness/exacerbation, and 1 comorbidity: ADD  are also affecting patient's functional outcome.      REHAB POTENTIAL: Good   CLINICAL DECISION MAKING: Stable/uncomplicated   EVALUATION COMPLEXITY: Low     GOALS: Goals reviewed with patient? No   SHORT TERM GOALS: Target date: 11/06/2021   Patient will be independent with initial home exercise program for self-management of symptoms. Baseline: Initial HEP provided at IE (10/23/21);  Goal status: MET     LONG TERM GOALS: Target date: 01/15/2022   Patient will be independent with a long-term home exercise program for self-management of symptoms.  Baseline: Initial HEP provided at IE (10/23/21); currently  participating well (11/29/2021);  Goal status: In-progress   2.  Patient will demonstrate improved FOTO to equal or greater than 69 by visit #12 to demonstrate improvement in overall condition and self-reported functional ability.  Baseline: 50 (10/23/21); 44 (11/13/2021); 67 at visit #9 (11/29/2021);  Goal status: In-progress   3.  Patient will demonstrate full lumbar AROM with no increase in pain or symptoms except for intermittent end range discomfort to improve his ability to complete work activities, get in and out of the car, and get dressed in the morning.  Baseline: limited and painful - see objective exam (10/23/21); no longer painful and ROM within normal limits for him (11/29/2021);  Goal status: Achieved 11/09/2021   4.  Patient will demonstrate B LE strength equal or greater than 5/5 MMT with no increase in pain to improve his ability to work out, care for his kids, and complete lifting activities.  Baseline: some motions painful and weak, especially left ankle dorsiflexion (10/23/21); patient continues to have similar pattern of apparently neurogenic weakness - see objective exam (11/29/2021);  Goal status: In-progress   5.  Patient will complete community, work and/or recreational activities without limitation due to current condition.  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   PLAN FOR NEXT SESSION: update HEP as appropriate, specific exercise for directional preference as appropriate, manual therapy and dry needling as needed, progressive core/LE/functional strengthening as tolerated.      Everlean Alstrom. Graylon Good, PT, DPT 11/29/21, 4:33 PM  [facility] [address] [place], [place] [address] P: [phone] I F: [phone]

## 2021-12-03 ENCOUNTER — Ambulatory Visit: Payer: Managed Care, Other (non HMO) | Admitting: Physical Therapy

## 2022-04-11 ENCOUNTER — Telehealth: Payer: Self-pay

## 2022-04-11 NOTE — Telephone Encounter (Signed)
We received FMLA form from [employer].  On the attached letter, it states that patient requested an extension through 10/08/22.  Form has been placed in your inbox for completion and signing.

## 2022-04-11 NOTE — Telephone Encounter (Signed)
Needs an appt please

## 2022-04-16 ENCOUNTER — Encounter: Payer: Self-pay | Admitting: Family Medicine

## 2022-04-16 ENCOUNTER — Ambulatory Visit (INDEPENDENT_AMBULATORY_CARE_PROVIDER_SITE_OTHER): Payer: Managed Care, Other (non HMO) | Admitting: Family Medicine

## 2022-04-16 VITALS — BP 118/68 | HR 74 | Temp 97.8°F | Ht 67.0 in | Wt 210.5 lb

## 2022-04-16 DIAGNOSIS — G8929 Other chronic pain: Secondary | ICD-10-CM

## 2022-04-16 DIAGNOSIS — M5442 Lumbago with sciatica, left side: Secondary | ICD-10-CM | POA: Diagnosis not present

## 2022-04-16 NOTE — Progress Notes (Signed)
Subjective:    Patient ID: Christopher West, male    DOB: 1980-10-30, 41 y.o.   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  Consistent with L4 radiculopathy (weakness in L ankle dorsiflexors)  In past prednisone and flexeril  Imaging consistent with L5-S1 disc deg   Did some PT  (per remport 70% improvement)  Intol of prednisone  Recommended voltaren gel (intol of oral nsaid)  H/o foot slap R   Finished his PT when he could not afford it after late June   Thinks the L ankle is stronger now   Home exercise program:  Stretches from PT , does 3 stretches (does once he is warmed up)  For fitness he lifts weights (lighter with more reps) some sit ups and push ups  Occ uses treadmill when he feels good     Is improved now  Less pain during the day in general  It was fine for a while : just less than 1 month  Pain is primarily in L buttock/thigh or foot  When he gets up in the am it takes 30 to 60 minutes to get warmed up and be able to move   Stomach sleeper This is difficult  Tries to roll to side   No medicines currently for pain    Work : For past few weeks he misses about 4-5 hours total  In general he goes in late to get his back pain under control     Imaging 10/2021  LS XR from 10/10/21 CLINICAL DATA:  Left low back pain with sciatica.   EXAM: LUMBAR SPINE - COMPLETE 4+ VIEW   COMPARISON:  No recent prior.   FINDINGS: Lumbar spine numbered the lowest segmented appearing lumbar shaped vertebrae on lateral view as L5. Mild L5-S1 disc degeneration. No acute or focal bony abnormality identified. Pelvic calcifications consistent phleboliths. Soft tissues are unremarkable.   IMPRESSION: Mild L5-S1 disc  degeneration, otherwise negative exam. No acute bony abnormality identified.  [place] pref for [place] is from 04/12/22 to 04/13/23 To miss up to 8 hours 5 times of each 7 d maximum and doctor visits/PT visits up to 1 day per week   Patient Active Problem List   Diagnosis Date Noted   Low back pain 10/10/2021   Current non-smoker 04/11/2021   ADD (attention deficit disorder) 04/11/2021   Stress reaction 08/04/2017   Skin lesion of back 08/04/2017   Routine general medical examination at a health care facility 12/07/2014   LIPOMA 01/18/2010   VENEREAL WART 05/27/2007   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 No pertinent past medical history. History reviewed. No pertinent surgical history. Social History   Tobacco Use   Smoking status: Never   Smokeless tobacco: Never  Vaping Use   Vaping Use: Never used  Substance Use Topics   Alcohol use: Yes    Alcohol/week: 0.0 standard drinks of alcohol    Comment: Rare   Drug use: Yes    Types: Marijuana    Comment: occ   History reviewed. No pertinent family history. Allergies  Allergen Reactions   Ativan [Lorazepam] Anxiety  Current Outpatient Medications on File Prior to Visit  Medication Sig Dispense Refill   VYVANSE 40 MG capsule Take 40 mg by mouth daily.     No current facility-administered medications on file prior to visit.     Review of Systems  Constitutional:  Positive for fatigue. Negative for activity change, appetite change, fever and unexpected weight change.  HENT:  Negative for congestion, rhinorrhea, sore throat and trouble swallowing.   Eyes:  Negative for pain, redness, itching and visual disturbance.  Respiratory:  Negative for cough, chest tightness, shortness of breath and wheezing.   Cardiovascular:  Negative for chest pain and palpitations.  Gastrointestinal:  Negative for abdominal pain, blood in stool, constipation, diarrhea and nausea.  Endocrine: Negative for  cold intolerance, heat intolerance, polydipsia and polyuria.  Genitourinary:  Negative for difficulty urinating, dysuria, frequency and urgency.  Musculoskeletal:  Positive for back pain and gait problem. Negative for arthralgias, joint swelling, myalgias and neck pain.  Skin:  Negative for pallor and rash.  Neurological:  Negative for dizziness, tremors, weakness, numbness and headaches.  Hematological:  Negative for adenopathy. Does not bruise/bleed easily.  Psychiatric/Behavioral:  Negative for decreased concentration and dysphoric mood. The patient is not nervous/anxious.        Objective:   Physical Exam Constitutional:      General: He is not in acute distress.    Appearance: Normal appearance. He is well-developed. He is obese. He is not ill-appearing or diaphoretic.  HENT:     Head: Normocephalic and atraumatic.  Eyes:     General: No scleral icterus.    Conjunctiva/sclera: Conjunctivae normal.     Pupils: Pupils are equal, round, and reactive to light.  Cardiovascular:     Rate and Rhythm: Normal rate and regular rhythm.  Pulmonary:     Effort: Pulmonary effort is normal.     Breath sounds: Normal breath sounds. No wheezing or rales.  Abdominal:     General: Bowel sounds are normal. There is no distension.     Palpations: Abdomen is soft.     Tenderness: There is no abdominal tenderness.  Musculoskeletal:        General: Tenderness present.     Cervical back: Normal range of motion and neck supple.     Lumbar back: Spasms and tenderness present. No edema or bony tenderness. Decreased range of motion.     Comments: Mild tenderness of L4, L5 regions of spine Also L SI area   Nl rom of hips Some pain with full flexion of spine  Lymphadenopathy:     Cervical: No cervical adenopathy.  Skin:    General: Skin is warm and dry.     Coloration: Skin is not pale.     Findings: No erythema or rash.  Neurological:     Mental Status: He is alert.     Cranial Nerves: Cranial  nerves 2-12 are intact. No cranial nerve deficit.     Sensory: No sensory deficit.     Motor: Weakness present. No tremor, atrophy, abnormal muscle tone or pronator drift.     Coordination: Coordination is intact. Coordination normal.     Gait: Gait is intact.     Deep Tendon Reflexes: Reflexes are normal and symmetric.     Comments: Pos SLR on L   4/5 strength for L ankle dorsiflexion  No foot drop noted on gait    Psychiatric:        Mood and Affect: Mood normal.  Assessment & Plan:   Problem List Items Addressed This Visit       Other   Low back pain - Primary    Improved after finishing PT in late June (with about 3 wk free of symptoms) but they returned  Now missed ave 4-5 h of work per week (usually in am to get back pain controlled before he can go in) No changes in exam Still c/o some weakness in L ankle dorsiflexion  Deg change on xr  Will order MRI now in light of continued symptoms after failing conservative tx including medication/ prednisone/ muscle relaxer and PT incl home exercise program ER precautions noted  Will continue FMLA for now from 04/12/22 to 04/13/23 with time off up to 8 h 5 d out of each 7 and medical tx 1 per 7      Relevant Orders   MR Lumbar Spine Wo Contrast

## 2022-04-16 NOTE — Assessment & Plan Note (Addendum)
Improved after finishing PT in late June (with about 3 wk free of symptoms) but they returned  Now missed ave 4-5 h of work per week (usually in am to get back pain controlled before he can go in) No changes in exam Still c/o some weakness in L ankle dorsiflexion  Deg change on xr  Will order MRI now in light of continued symptoms after failing conservative tx including medication/ prednisone/ muscle relaxer and PT incl home exercise program ER precautions noted  Will continue FMLA for now from 04/12/22 to 04/13/23 with time off up to 8 h 5 d out of each 7 and medical tx 1 per 7

## 2022-04-16 NOTE — Patient Instructions (Signed)
I will order a lumbar spine MRI  If you don't get a call in 1-2 weeks to set this up let us know   Take care of yourself  Continue your home exercise program Work on abd strength if possible   No changes for FMLA for now

## 2022-04-17 NOTE — Telephone Encounter (Signed)
Completed form has been faxed to fax# [phone].  Received fax confirmation.  Copies have been made for scanning, patient, and myself.  I spoke to patient over phone.  He requested his copy be mailed to address on system.

## 2022-09-27 ENCOUNTER — Other Ambulatory Visit: Payer: Self-pay

## 2022-09-27 MED ORDER — LISDEXAMFETAMINE DIMESYLATE 40 MG PO CAPS
40.0000 mg | ORAL_CAPSULE | Freq: Every day | ORAL | 0 refills | Status: DC
  Filled 2022-09-27: qty 30, 30d supply, fill #0

## 2022-09-30 ENCOUNTER — Other Ambulatory Visit: Payer: Self-pay

## 2022-12-10 ENCOUNTER — Ambulatory Visit
Admission: RE | Admit: 2022-12-10 | Discharge: 2022-12-10 | Disposition: A | Discharge status: Home / Self Care | Payer: 59 | Source: Ambulatory Visit | Attending: Infectious Diseases | Admitting: Infectious Diseases

## 2022-12-10 ENCOUNTER — Telehealth: Payer: Self-pay | Admitting: Family Medicine

## 2022-12-10 ENCOUNTER — Ambulatory Visit: Payer: Managed Care, Other (non HMO) | Admitting: Family

## 2022-12-10 VITALS — BP 115/72 | HR 56 | Temp 97.8°F | Resp 16

## 2022-12-10 DIAGNOSIS — I498 Other specified cardiac arrhythmias: Secondary | ICD-10-CM

## 2022-12-10 NOTE — Telephone Encounter (Signed)
I spoke with pt; No CP or SOB; pt said noticed fluttering of heart last night that continues this morning; no way to ck BP but pulse is 51. Pt said he is ging to sleep for awhile and then he will go to [facility] ED; pt said was up until 5 AM.advised pt needs to go ahead and go to ED and pt voiced understanding and said his wife is a Engineer, civil (consulting) and he will talk with her. Pt did understand that appt scheduled at [facility] was cancelled and pt does need to go to ED.Pt did say he will go to [facility] ED sometime this morning. Sending note to Dr Milinda Antis.

## 2022-12-10 NOTE — ED Triage Notes (Signed)
Patient presents to [facility] for dizziness yesterday evening, intermittent heart fluttering and chest discomfort when taking a deep breath x 1 week. He states he called his PCP today and was instructed to go to the ED.   Denies SOB, no respiratory symptoms,  no drug use, not taking any meds.

## 2022-12-10 NOTE — Discharge Instructions (Signed)
Follow up here or with your primary care provider if your symptoms are worsening or not improving.     

## 2022-12-10 NOTE — ED Provider Notes (Signed)
Renaldo Fiddler    CSN: [id number] Arrival date & time: 12/10/22  1203      History   Chief Complaint Chief Complaint  Patient presents with   Dizziness    Heart flutter or bronchitis - Entered by patient    HPI Christopher West is a 42 y.o. male.    Dizziness   Presents to urgent care with complaint of "fluttering" sensation that he feels in his chest, mostly when he is at rest.  He denies any known triggers for this sensation.  He states his primary care provider instructed him to be evaluated.  He reports "dizziness" at times when he has the fluttering sensation.  He likens it to the feeling he experienced in his youth when he was an recreational drug user.  No past medical history on file.  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     Home Medications    Prior to Admission medications   Medication Sig Start Date End Date Taking? Authorizing Provider  lisdexamfetamine (VYVANSE) 40 MG capsule Take 1 capsule (40 mg total) by mouth daily. 09/27/22     VYVANSE 40 MG capsule Take 40 mg by mouth daily. 10/08/19   [provider]    Family History No family history on file.  Social History Social History   Tobacco Use   Smoking status: Never   Smokeless tobacco: Never  Vaping Use   Vaping Use: Never used  Substance Use Topics   Alcohol use: Yes    Alcohol/week: 0.0 standard drinks of alcohol    Comment: Rare   Drug use: Yes    Types: Marijuana    Comment: occ     Allergies   Ativan [lorazepam]   Review of Systems Review of Systems  Neurological:   Positive for dizziness.     Physical Exam Triage Vital Signs ED Triage Vitals [12/10/22 1219]  Enc Vitals Group     BP      Pulse      Resp 16     Temp      Temp Source Temporal     SpO2      Weight      Height      Head Circumference      Peak Flow      Pain Score      Pain Loc      Pain Edu?      Excl. in GC?    No data found.  Updated Vital Signs Resp 16   Visual Acuity Right Eye Distance:   Left Eye Distance:   Bilateral Distance:    Right Eye Near:   Left Eye Near:    Bilateral Near:     Physical Exam Vitals reviewed.  Constitutional:      Appearance: Normal appearance.  Cardiovascular:     Rate and Rhythm: Normal rate and regular rhythm.     Pulses: Normal pulses.     Heart sounds: Normal heart sounds.  Pulmonary:     Effort: Pulmonary effort is normal.     Breath sounds:  Normal breath sounds.  Skin:    General: Skin is warm and dry.  Neurological:     General: No focal deficit present.     Mental Status: He is alert and oriented to person, place, and time.  Psychiatric:        Mood and Affect: Mood normal.        Behavior: Behavior normal.      UC Treatments / Results  Labs (all labs ordered are listed, but only abnormal results are displayed) Labs Reviewed - No data to display  EKG   Radiology No results found.  Procedures Procedures (including critical care time)  Medications Ordered in UC Medications - No data to display  Initial Impression / Assessment and Plan / UC Course  I have reviewed the triage vital signs and the nursing notes.  Pertinent labs & imaging results that were available during my care of the patient were reviewed by me and considered in my medical decision making (see chart for details).   Christopher West is a 42 y.o. male presenting with flutter. Patient is afebrile without recent antipyretics, satting well on room air. Overall is very well appearing, well hydrated, without respiratory distress.  Pulmonary exam is unremarkable.  Lungs CTAB without wheezing, rhonchi, rales. RRR without murmurs, rubs, gallops.  Reviewed relevant chart history.   ECG was obtained and shows normal cardiac rhythm though bradycardic.  Discussed the patient's symptoms with him as well as possible differential diagnoses which include but are not limited to stress, GERD, intermittent cardiac arrhythmia.  Recommended follow-up with his primary care provider for additional evaluation if his symptoms continue.  Counseled patient on potential for adverse effects with medications prescribed/recommended today, ER and return-to-clinic precautions discussed, patient verbalized understanding and agreement with care plan.  Final Clinical Impressions(s) / UC Diagnoses   Final diagnoses:  None   Discharge Instructions   None    ED Prescriptions   None    PDMP not reviewed this encounter.   Charma Igo, [place] 12/10/22 1258

## 2022-12-10 NOTE — Telephone Encounter (Signed)
Pt called stating he has been experiencing sob, he believes it may be heart related. Pt states last night, 7/1, his sob was really affecting him. Scheduled pt with [facility] for today, 7/2 @ 12:40pm. Transferred pt to access nurse. Call back # [phone]

## 2022-12-10 NOTE — Telephone Encounter (Signed)
Aware, will watch for correspondence In ER now

## 2022-12-10 NOTE — Telephone Encounter (Signed)
FYI: This call has been transferred to Access Nurse. Once the result note has been entered staff can address the message at that time.  Patient called in with the following symptoms:  Red Word:chest pain, SOB, current heart rate 51   Please advise at [facility] [phone]  Message is routed to Provider Pool and [facility] Triage

## 2022-12-11 ENCOUNTER — Encounter: Payer: Self-pay | Admitting: Family Medicine

## 2022-12-11 ENCOUNTER — Ambulatory Visit (INDEPENDENT_AMBULATORY_CARE_PROVIDER_SITE_OTHER): Payer: 59 | Admitting: Family Medicine

## 2022-12-11 VITALS — BP 118/70 | HR 71 | Temp 98.2°F | Ht 67.0 in | Wt 208.2 lb

## 2022-12-11 DIAGNOSIS — Z8241 Family history of sudden cardiac death: Secondary | ICD-10-CM | POA: Diagnosis not present

## 2022-12-11 DIAGNOSIS — R002 Palpitations: Secondary | ICD-10-CM | POA: Diagnosis not present

## 2022-12-11 NOTE — Patient Instructions (Addendum)
I put the referral in for cardiology to discuss palpitations and family hisorty of heart problem  Please let us know if you don't hear in 1-2 weeks    Avoid caffeine  Stay hydrated Take care of yourself   If symptoms worsen in the meantime let me know  If anything severe - go to ER for evaluation

## 2022-12-11 NOTE — Assessment & Plan Note (Addendum)
Acute on chronic For several months / on/off and fleeting through the day and night  One episode of dizzy spell with it  Recent UC visit was reassuring with no acute changes on EKG and rate in 50s Pending labs from them No exercise intolerance  Is cutting back on caffeine and stays hydrated  Out of his ADD medicine recently  Some stressors that may add  No recent viral illness or covid   Referred to cardiology for further eval of this in setting of fam history of sudden cardiac death  Normal exam and vitals today

## 2022-12-11 NOTE — Assessment & Plan Note (Signed)
Half brother died of sudden cardiac death in 36s Unsure what the cause was

## 2022-12-11 NOTE — Progress Notes (Signed)
Subjective:    Patient ID: Christopher West, male    DOB: September 18, 1980, 42 y.o.   MRN: [id number]  HPI  Wt Readings from Last 3 Encounters:  31-Dec-2022 208 lb 3.2 oz (94.4 kg)  04/16/22 210 lb 8 oz (95.5 kg)  11/12/21 210 lb 6.4 oz (95.4 kg)   32.61 kg/m  Vitals:   2022-12-31 1237  BP: 118/70  Pulse: 71  Temp: 98.2 F (36.8 C)  SpO2: 97%   Pt presents for c/o heart fluttering   Was seen at [facility] in [place] yesterday  C/o fluttering sensation in chest  Also dizziness during those episodes   Very fleeting and quick   Normal exam and vitals  EKG noted sinus bradycardia (rate of 53) ? QT shortened from previous   Was taking vyvanse and now it is very hard to find at pharmacy Feels more overwhelmed   This happens on and off during the day  Feels like a little bounce in the chest  2 nights ago dizziness happened with it                         (no current recreational drugs but used in the past)  No alcohol   Drinks selzer water/ tries to stay hydrated   Drinks mushroom coffee  Used to have a cappuccino per day No longer every day   Some stress  This may be a trigger  Is starting some school/ real estate recently  Wife was out of work for a while and some financial issues    Brother died (sudden cardiac death) in his 63s   Lab Results  Component Value Date   WBC 4.9 08/04/2017   HGB 13.6 08/04/2017   HCT 42.7 08/04/2017   MCV 90 08/04/2017   PLT 379 08/04/2017    Patient Active Problem List   Diagnosis Date Noted   Palpitations December 31, 2022   Family history of sudden cardiac death 31-Dec-2022   Low back pain 10/10/2021   Current non-smoker 04/11/2021   ADD (attention deficit disorder) 04/11/2021   Stress reaction 08/04/2017   Skin lesion of back 08/04/2017   Routine general medical examination at a health care facility 12/07/2014   LIPOMA 01/18/2010   VENEREAL WART 05/27/2007   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 No pertinent past medical history. History reviewed. No pertinent surgical history. Social History   Tobacco Use   Smoking status: Never   Smokeless tobacco: Never  Vaping Use   Vaping Use: Never used  Substance Use Topics   Alcohol use: Not Currently    Comment: Rare   Drug use: Yes    Types: Marijuana    Comment: occ   History reviewed. No pertinent family history. Allergies  Allergen Reactions   Ativan [Lorazepam] Anxiety   Current Outpatient Medications on File Prior to Visit  Medication Sig Dispense Refill   lisdexamfetamine (VYVANSE) 40 MG capsule Take 1 capsule (40 mg total) by mouth daily. 30 capsule 0   VYVANSE 40 MG capsule Take 40 mg by mouth daily.     No current facility-administered medications on file prior to visit.    Review of Systems  Constitutional:  Negative for activity change, appetite change, fatigue, fever and unexpected weight change.  HENT:  Negative for congestion, rhinorrhea, sore throat and trouble swallowing.   Eyes:  Negative for pain, redness, itching and visual disturbance.  Respiratory:  Negative for cough,  chest tightness, shortness of breath and wheezing.   Cardiovascular:  Positive for palpitations. Negative for chest pain and leg swelling.  Gastrointestinal:  Negative for abdominal pain, blood in stool, constipation, diarrhea and nausea.  Endocrine: Negative for cold intolerance, heat intolerance, polydipsia and polyuria.  Genitourinary:  Negative for difficulty urinating, dysuria, frequency and urgency.  Musculoskeletal:  Negative for arthralgias, joint swelling and myalgias.  Skin:  Negative for pallor and rash.  Neurological:  Positive for dizziness. Negative for tremors, weakness, numbness and headaches.  Hematological:  Negative for adenopathy. Does not bruise/bleed easily.  Psychiatric/Behavioral:  Negative for decreased concentration and dysphoric mood. The patient is nervous/anxious.        Some stressors causing  anxiety        Objective:   Physical Exam Constitutional:      General: He is not in acute distress.    Appearance: Normal appearance. He is well-developed and normal weight. He is not ill-appearing or diaphoretic.  HENT:     Head: Normocephalic and atraumatic.     Mouth/Throat:     Mouth: Mucous membranes are moist.  Eyes:     General: No scleral icterus.    Conjunctiva/sclera: Conjunctivae normal.     Pupils: Pupils are equal, round, and reactive to light.  Neck:     Thyroid: No thyromegaly.     Vascular: No carotid bruit or JVD.  Cardiovascular:     Rate and Rhythm: Normal rate and regular rhythm.     Pulses: Normal pulses.     Heart sounds: Normal heart sounds. No murmur heard.    No gallop.  Pulmonary:     Effort: Pulmonary effort is normal. No respiratory distress.     Breath sounds: Normal breath sounds. No stridor. No wheezing, rhonchi or rales.  Abdominal:     General: There is no distension or abdominal bruit.     Palpations: Abdomen is soft.  Musculoskeletal:     Cervical back: Normal range of motion and neck supple.     Right lower leg: No edema.     Left lower leg: No edema.  Lymphadenopathy:     Cervical: No cervical adenopathy.  Skin:    General: Skin is warm and dry.     Coloration: Skin is not pale.     Findings: No rash.  Neurological:     Mental Status: He is alert.     Coordination: Coordination normal.     Deep Tendon Reflexes: Reflexes are normal and symmetric. Reflexes normal.     Comments: No tremor   Psychiatric:        Mood and Affect: Mood normal.           Assessment & Plan:   Problem List Items Addressed This Visit       Cardiovascular and Mediastinum   Family history of sudden cardiac death    Half brother died of sudden cardiac death in 54s Unsure what the cause was       Relevant Orders   Ambulatory referral to Cardiology     Other   Palpitations - Primary    Acute on chronic For several months / on/off and  fleeting through the day and night  One episode of dizzy spell with it  Recent UC visit was reassuring with no acute changes on EKG and rate in 50s Pending labs from them No exercise intolerance  Is cutting back on caffeine and stays hydrated  Out of his ADD medicine recently  Some  stressors that may add  No recent viral illness or covid   Referred to cardiology for further eval of this in setting of fam history of sudden cardiac death  Normal exam and vitals today        Relevant Orders   Ambulatory referral to Cardiology

## 2022-12-15 IMAGING — DX DG LUMBAR SPINE COMPLETE 4+V
5 series · 5 of 5 positions shown · non-contrast
Comparison: No recent prior.

CLINICAL DATA: Left low back pain with sciatica.

EXAM:
LUMBAR SPINE - COMPLETE 4+ VIEW

[lumbar spine ap]
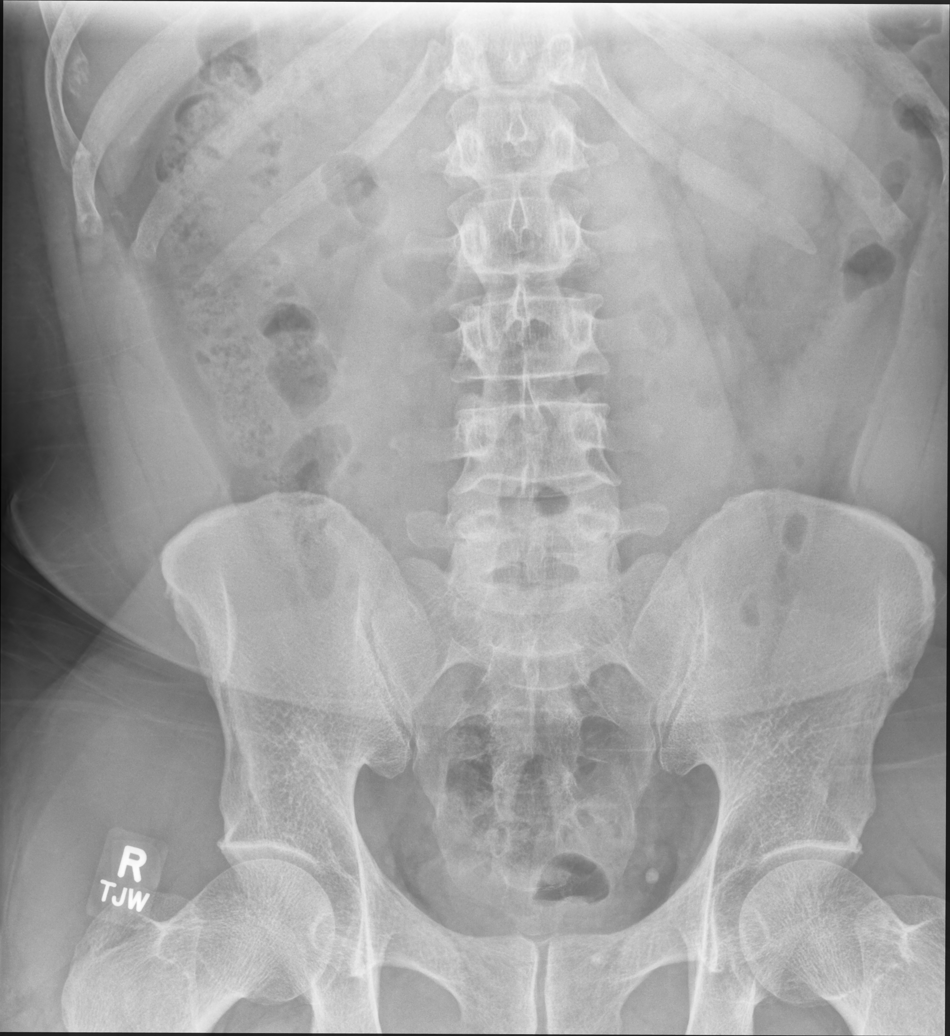

[lumbar spine mlo (1 of 2)]
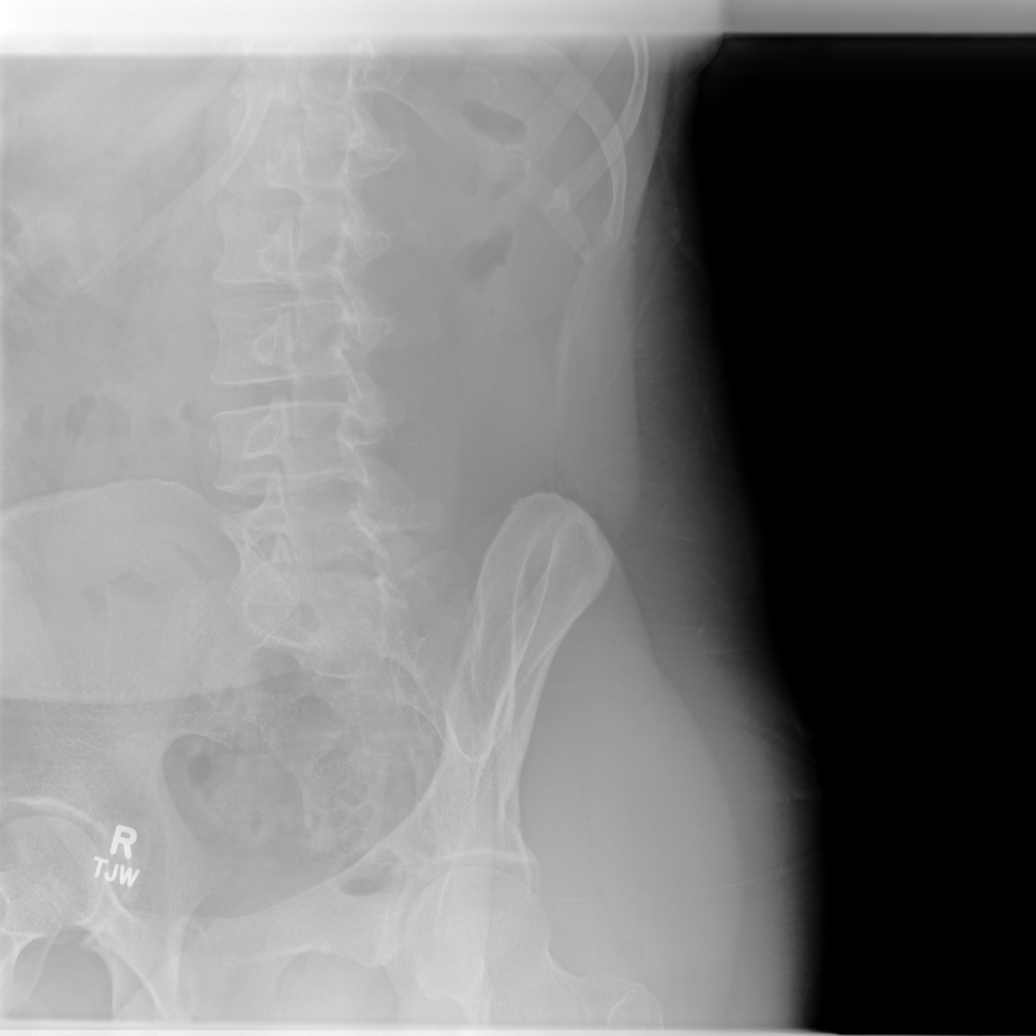

[lumbar spine mlo (2 of 2)]
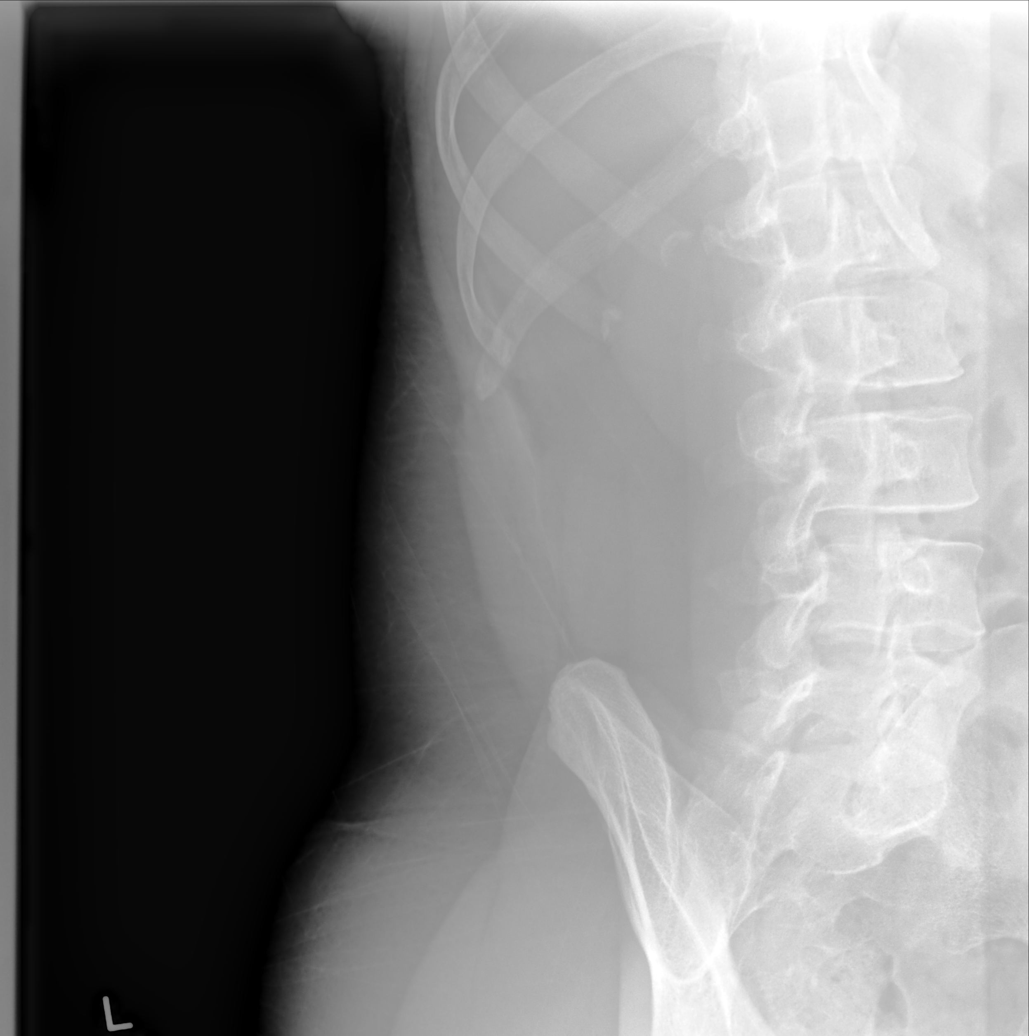

[lumbar spine lat (1 of 2)]
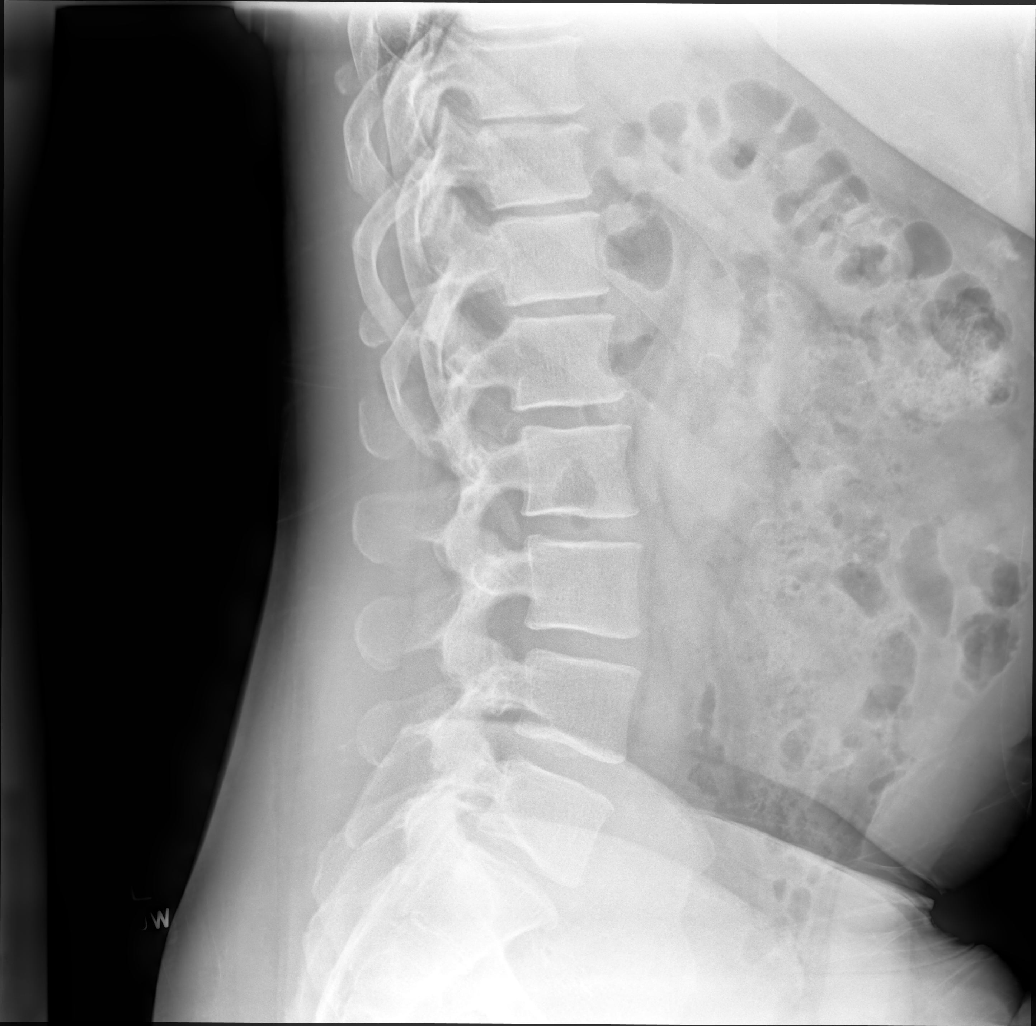

[lumbar spine lat (2 of 2)]
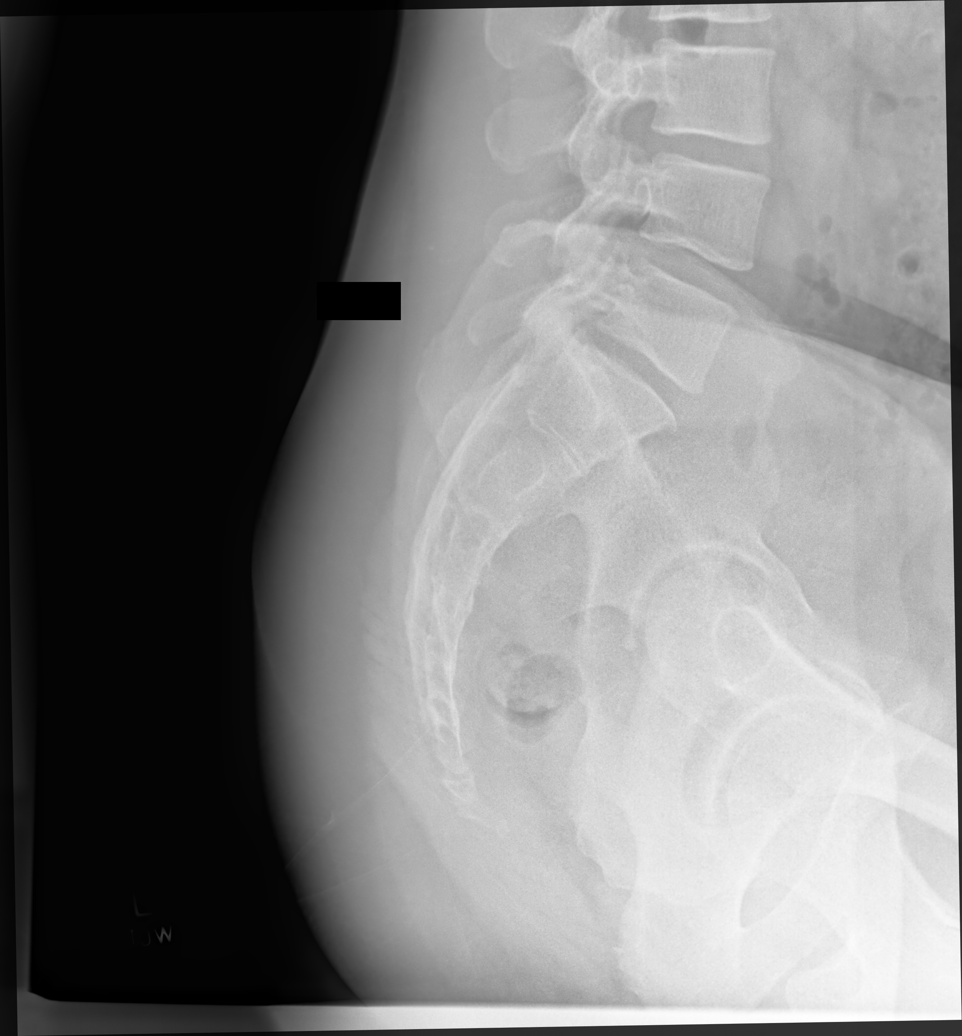

[5 of 5 positions shown; findings below may reference images not displayed]

FINDINGS: Lumbar spine numbered the lowest segmented appearing lumbar shaped
vertebrae on lateral view as L5. Mild L5-S1 disc degeneration. No
acute or focal bony abnormality identified. Pelvic calcifications
consistent phleboliths. Soft tissues are unremarkable.
IMPRESSION: Mild L5-S1 disc degeneration, otherwise negative exam. No acute bony
abnormality identified.

## 2023-02-26 ENCOUNTER — Ambulatory Visit: Payer: 59

## 2023-02-26 ENCOUNTER — Encounter: Payer: Self-pay | Admitting: Cardiology

## 2023-02-26 ENCOUNTER — Ambulatory Visit: Payer: 59 | Attending: Cardiology | Admitting: Cardiology

## 2023-02-26 VITALS — BP 124/82 | HR 59 | Ht 68.0 in | Wt 211.0 lb

## 2023-02-26 DIAGNOSIS — R002 Palpitations: Secondary | ICD-10-CM

## 2023-02-26 DIAGNOSIS — R9431 Abnormal electrocardiogram [ECG] [EKG]: Secondary | ICD-10-CM

## 2023-02-26 DIAGNOSIS — Z8241 Family history of sudden cardiac death: Secondary | ICD-10-CM | POA: Diagnosis not present

## 2023-02-26 NOTE — Patient Instructions (Signed)
Medication Instructions:  No changes *If you need a refill on your cardiac medications before your next appointment, please call your pharmacy*   Lab Work: None ordered If you have labs (blood work) drawn today and your tests are completely normal, you will receive your results only by: MyChart Message (if you have MyChart) OR A paper copy in the mail If you have any lab test that is abnormal or we need to change your treatment, we will call you to review the results.   Testing/Procedures: Your physician has requested that you have an echocardiogram. Echocardiography is a painless test that uses sound waves to create images of your heart. It provides your doctor with information about the size and shape of your heart and how well your heart's chambers and valves are working.   You may receive an ultrasound enhancing agent through an IV if needed to better visualize your heart during the echo. This procedure takes approximately one hour.  There are no restrictions for this procedure.  This will take place at [address] ([facility]) [address], [place] [address]    Follow-Up: At [facility], you and your health needs are our priority.  As part of our continuing mission to provide you with exceptional heart care, we have created designated Provider Care Teams.  These Care Teams include your primary Cardiologist (physician) and Advanced Practice Providers (APPs -  Physician Assistants and Nurse Practitioners) who all work together to provide you with the care you need, when you need it.  We recommend signing up for the patient portal called "MyChart".  Sign up information is provided on this After Visit Summary.  MyChart is used to connect with patients for Virtual Visits (Telemedicine).  Patients are able to view lab/test results, encounter notes, upcoming appointments, etc.  Non-urgent messages can be sent to your provider as well.   To learn more about what you can  do with MyChart, go to [email or web address].    Your next appointment:   2 month(s)  Provider:   You may see Dr. Azucena Cecil or one of the following Advanced Practice Providers on your designated Care Team:   Nicolasa Ducking, NP Eula Listen, PA-C Cadence Fransico Michael, PA-C Charlsie Quest, NP    Other Instructions Christena Deem- Long Term Monitor Instructions  Your physician has requested you wear a ZIO patch monitor for 14 days.  This is a single patch monitor. [employer] supplies one patch monitor per enrollment. Additional stickers are not available. Please do not apply patch if you will be having a Nuclear Stress Test,  Echocardiogram, Cardiac CT, MRI, or Chest Xray during the period you would be wearing the  monitor. The patch cannot be worn during these tests. You cannot remove and re-apply the  ZIO XT patch monitor.  Your ZIO patch monitor will be mailed 3 day USPS to your address on file. It may take 3-5 days  to receive your monitor after you have been enrolled.  Once you have received your monitor, please review the enclosed instructions. Your monitor  has already been registered assigning a specific monitor serial # to you.  Billing and Patient Assistance Program Information  We have supplied [employer] with any of your insurance information on file for billing purposes. [employer] offers a sliding scale Patient Assistance Program for patients that do not have  insurance, or whose insurance does not completely cover the cost of the ZIO monitor.  You must apply for the Patient Assistance Program to qualify for  this discounted rate.  To apply, please call [employer] at [phone], select option 4, select option 2, ask to apply for  Patient Assistance Program. Meredeth Ide will ask your household income, and how many people  are in your household. They will quote your out-of-pocket cost based on that information.  [employer] will also be able to set up a 53-month, interest-free payment plan if  needed.  Applying the monitor   Shave hair from upper left chest.  Hold abrader disc by orange tab. Rub abrader in 40 strokes over the upper left chest as  indicated in your monitor instructions.  Clean area with 4 enclosed alcohol pads. Let dry.  Apply patch as indicated in monitor instructions. Patch will be placed under collarbone on left  side of chest with arrow pointing upward.  Rub patch adhesive wings for 2 minutes. Remove white label marked "1". Remove the white  label marked "2". Rub patch adhesive wings for 2 additional minutes.  While looking in a mirror, press and release button in center of patch. A small green light will  flash 3-4 times. This will be your only indicator that the monitor has been turned on.  Do not shower for the first 24 hours. You may shower after the first 24 hours.  Press the button if you feel a symptom. You will hear a small click. Record Date, Time and  Symptom in the Patient Logbook.  When you are ready to remove the patch, follow instructions on the last 2 pages of Patient  Logbook. Stick patch monitor onto the last page of Patient Logbook.  Place Patient Logbook in the blue and white box. Use locking tab on box and tape box closed  securely. The blue and white box has prepaid postage on it. Please place it in the mailbox as  soon as possible. Your physician should have your test results approximately 7 days after the  monitor has been mailed back to [facility].  Call [facility] Customer Care at [phone] if you have questions regarding  your ZIO XT patch monitor. Call them immediately if you see an orange light blinking on your  monitor.  If your monitor falls off in less than 4 days, contact our Monitor department at [phone].  If your monitor becomes loose or falls off after 4 days call [employer] at [phone] for  suggestions on securing your monitor

## 2023-02-26 NOTE — Progress Notes (Signed)
Cardiology Office Note:    Date:  02/26/2023   ID:  Christopher West, DOB 1981-05-22, MRN [id number]  PCP:  Christopher Pimple, MD    HeartCare Providers Cardiologist:  Christopher Odea, MD     Referring MD: Christopher Pimple, MD   Chief Complaint  Patient presents with   Follow-up    Referred for cardiac evaluation of Palpitations with family history of sudden cardiac death.     Christopher West is a 42 y.o. male who is being seen today for the evaluation of palpitations at the request of Tower, Audrie Gallus, MD.   History of Present Illness:    Christopher West is a 42 y.o. male with a hx of ADD, family history of sudden cardiac death presenting with symptoms of palpitations.  States having symptoms of palpitations ongoing over the past 2 months.  Symptoms are not associated with exertion.  Has attributed symptoms to lack of sleep, stress.  Sometimes he works late on [employer].  Also takes Vyvanse for ADD.  Has been on Vyvanse for about 2 years now.  Half brother died suddenly from cardiac causes.  Denies palpitations, chest pain, dizziness or syncope.  History reviewed. No pertinent past medical history.  History reviewed. No pertinent surgical history.  Current Medications: Current Meds  Medication Sig   lisdexamfetamine (VYVANSE) 40 MG capsule Take 1 capsule (40 mg total) by mouth daily.   VYVANSE 40 MG capsule Take 40 mg by mouth daily.     Allergies:   Ativan [lorazepam]   Social History   Socioeconomic History   Marital status: Single    Spouse name: Not on file   Number of children: Not on file   Years of education: Not on file   Highest education level: Not on file  Occupational History   Not on file  Tobacco Use   Smoking status: Never   Smokeless tobacco: Never  Vaping Use   Vaping status: Never Used  Substance and Sexual Activity   Alcohol use: Not Currently    Comment: Rare   Drug use: Yes    Types: Marijuana    Comment: occ   Sexual  activity: Not on file  Other Topics Concern   Not on file  Social History Narrative   Not on file   Social Determinants of Health   Financial Resource Strain: Not on file  Food Insecurity: Not on file  Transportation Needs: Not on file  Physical Activity: Not on file  Stress: Not on file  Social Connections: Not on file     Family History: The patient's family history includes Heart attack in his father; Heart disease in his maternal grandfather; Heart failure in his maternal grandfather.  ROS:   Please see the history of present illness.     All other systems reviewed and are negative.  EKGs/Labs/Other Studies Reviewed:    The following studies were reviewed today:  EKG Interpretation Date/Time:  Wednesday February 26 2023 09:43:38 EDT Ventricular Rate:  59 PR Interval:  170 QRS Duration:  94 QT Interval:  410 QTC Calculation: 405 R Axis:   21  Text Interpretation: Sinus bradycardia with sinus arrhythmia Nonspecific T wave abnormality Confirmed by Christopher West ([id number]) on 02/26/2023 9:57:26 AM    Recent Labs: No results found for requested labs within last 365 days.  Recent Lipid Panel    Component Value Date/Time   CHOL 147 08/04/2017 1618   TRIG 58 08/04/2017 1618   HDL 57  08/04/2017 1618   CHOLHDL 2.6 08/04/2017 1618   LDLCALC 78 08/04/2017 1618     Risk Assessment/Calculations:             Physical Exam:    VS:  BP 124/82 (BP Location: Left Arm, Patient Position: Sitting, Cuff Size: Normal)   Pulse (!) 59   Ht 5\' 8"  (1.727 m)   Wt 211 lb (95.7 kg)   SpO2 97%   BMI 32.08 kg/m     Wt Readings from Last 3 Encounters:  02/26/23 211 lb (95.7 kg)  12/11/22 208 lb 3.2 oz (94.4 kg)  04/16/22 210 lb 8 oz (95.5 kg)     GEN:  Well nourished, well developed in no acute distress HEENT: Normal NECK: No JVD; No carotid bruits CARDIAC: RRR, no murmurs, rubs, gallops RESPIRATORY:  Clear to auscultation without rales, wheezing or rhonchi  ABDOMEN:  Soft, non-tender, non-distended MUSCULOSKELETAL:  No edema; No deformity  SKIN: Warm and dry NEUROLOGIC:  Alert and oriented x 3 PSYCHIATRIC:  Normal affect   ASSESSMENT:    1. Palpitations   2. Family history of sudden cardiac death   3. Abnormal EKG    PLAN:    In order of problems listed above:  Palpitations, place cardiac monitor to evaluate any significant arrhythmias, stimulant/Vyvanse might be contributing. Family history of sudden cardiac death in half brother, obtain echo, cardiac monitor as above. Nonspecific T wave abnormalities on EKG. denies chest pain.  Echo as above.  Follow-up after cardiac testing      Medication Adjustments/Labs and Tests Ordered: Current medicines are reviewed at length with the patient today.  Concerns regarding medicines are outlined above.  Orders Placed This Encounter  Procedures   LONG TERM MONITOR (3-14 DAYS)   EKG 12-Lead   ECHOCARDIOGRAM COMPLETE   No orders of the defined types were placed in this encounter.   Patient Instructions  Medication Instructions:  No changes *If you need a refill on your cardiac medications before your next appointment, please call your pharmacy*   Lab Work: None ordered If you have labs (blood work) drawn today and your tests are completely normal, you will receive your results only by: MyChart Message (if you have MyChart) OR A paper copy in the mail If you have any lab test that is abnormal or we need to change your treatment, we will call you to review the results.   Testing/Procedures: Your physician has requested that you have an echocardiogram. Echocardiography is a painless test that uses sound waves to create images of your heart. It provides your doctor with information about the size and shape of your heart and how well your heart's chambers and valves are working.   You may receive an ultrasound enhancing agent through an IV if needed to better visualize your heart during the  echo. This procedure takes approximately one hour.  There are no restrictions for this procedure.  This will take place at [address] ([facility], [place] [address]    Follow-Up: At [facility], you and your health needs are our priority.  As part of our continuing mission to provide you with exceptional heart care, we have created designated Provider Care Teams.  These Care Teams include your primary Cardiologist (physician) and Advanced Practice Providers (APPs -  Physician Assistants and Nurse Practitioners) who all work together to provide you with the care you need, when you need it.  We recommend signing up for the patient portal called "  MyChart".  Sign up information is provided on this After Visit Summary.  MyChart is used to connect with patients for Virtual Visits (Telemedicine).  Patients are able to view lab/test results, encounter notes, upcoming appointments, etc.  Non-urgent messages can be sent to your provider as well.   To learn more about what you can do with MyChart, go to [email or web address].    Your next appointment:   2 month(s)  Provider:   You may see Dr. Azucena Cecil or one of the following Advanced Practice Providers on your designated Care Team:   Nicolasa Ducking, NP Eula Listen, PA-C Cadence Fransico Michael, PA-C Charlsie Quest, NP    Other Instructions Christena Deem- Long Term Monitor Instructions  Your physician has requested you wear a ZIO patch monitor for 14 days.  This is a single patch monitor. [employer] supplies one patch monitor per enrollment. Additional stickers are not available. Please do not apply patch if you will be having a Nuclear Stress Test,  Echocardiogram, Cardiac CT, MRI, or Chest Xray during the period you would be wearing the  monitor. The patch cannot be worn during these tests. You cannot remove and re-apply the  ZIO XT patch monitor.  Your ZIO patch monitor will be mailed 3 day USPS to your address on file. It  may take 3-5 days  to receive your monitor after you have been enrolled.  Once you have received your monitor, please review the enclosed instructions. Your monitor  has already been registered assigning a specific monitor serial # to you.  Billing and Patient Assistance Program Information  We have supplied [employer] with any of your insurance information on file for billing purposes. [employer] offers a sliding scale Patient Assistance Program for patients that do not have  insurance, or whose insurance does not completely cover the cost of the ZIO monitor.  You must apply for the Patient Assistance Program to qualify for this discounted rate.  To apply, please call [employer] at [phone], select option 4, select option 2, ask to apply for  Patient Assistance Program. Meredeth Ide will ask your household income, and how many people  are in your household. They will quote your out-of-pocket cost based on that information.  [employer] will also be able to set up a 39-month, interest-free payment plan if needed.  Applying the monitor   Shave hair from upper left chest.  Hold abrader disc by orange tab. Rub abrader in 40 strokes over the upper left chest as  indicated in your monitor instructions.  Clean area with 4 enclosed alcohol pads. Let dry.  Apply patch as indicated in monitor instructions. Patch will be placed under collarbone on left  side of chest with arrow pointing upward.  Rub patch adhesive wings for 2 minutes. Remove white label marked "1". Remove the white  label marked "2". Rub patch adhesive wings for 2 additional minutes.  While looking in a mirror, press and release button in center of patch. A small green light will  flash 3-4 times. This will be your only indicator that the monitor has been turned on.  Do not shower for the first 24 hours. You may shower after the first 24 hours.  Press the button if you feel a symptom. You will hear a small click. Record Date, Time and  Symptom  in the Patient Logbook.  When you are ready to remove the patch, follow instructions on the last 2 pages of Patient  Logbook. Stick patch monitor onto the last page of Patient Logbook.  Place Patient Logbook in the blue and white box. Use locking tab on box and tape box closed  securely. The blue and white box has prepaid postage on it. Please place it in the mailbox as  soon as possible. Your physician should have your test results approximately 7 days after the  monitor has been mailed back to [facility].  Call [facility] Customer Care at [phone] if you have questions regarding  your ZIO XT patch monitor. Call them immediately if you see an orange light blinking on your  monitor.  If your monitor falls off in less than 4 days, contact our Monitor department at [phone].  If your monitor becomes loose or falls off after 4 days call [employer] at [phone] for  suggestions on securing your monitor     Signed, Christopher Odea, MD  02/26/2023 10:27 AM    [facility]

## 2023-03-03 DIAGNOSIS — R002 Palpitations: Secondary | ICD-10-CM | POA: Diagnosis not present

## 2023-03-06 ENCOUNTER — Other Ambulatory Visit: Payer: Self-pay | Admitting: Cardiology

## 2023-03-06 DIAGNOSIS — R9431 Abnormal electrocardiogram [ECG] [EKG]: Secondary | ICD-10-CM

## 2023-03-06 DIAGNOSIS — R002 Palpitations: Secondary | ICD-10-CM

## 2023-03-06 DIAGNOSIS — Z8241 Family history of sudden cardiac death: Secondary | ICD-10-CM

## 2023-03-20 ENCOUNTER — Other Ambulatory Visit: Payer: 59

## 2023-03-20 ENCOUNTER — Ambulatory Visit: Payer: 59 | Attending: Cardiology

## 2023-03-20 DIAGNOSIS — Z8241 Family history of sudden cardiac death: Secondary | ICD-10-CM | POA: Diagnosis not present

## 2023-03-20 DIAGNOSIS — R9431 Abnormal electrocardiogram [ECG] [EKG]: Secondary | ICD-10-CM

## 2023-03-20 LAB — ECHOCARDIOGRAM COMPLETE
Area-P 1/2: 3.6 cm2
S' Lateral: 3.1 cm

## 2023-04-23 ENCOUNTER — Ambulatory Visit: Payer: 59 | Admitting: Urology

## 2023-04-23 ENCOUNTER — Encounter: Payer: Self-pay | Admitting: Urology

## 2023-04-23 VITALS — BP 119/75 | HR 76 | Ht 68.0 in | Wt 210.0 lb

## 2023-04-23 DIAGNOSIS — N5082 Scrotal pain: Secondary | ICD-10-CM | POA: Diagnosis not present

## 2023-04-23 MED ORDER — ALFUZOSIN HCL ER 10 MG PO TB24
10.0000 mg | ORAL_TABLET | Freq: Every day | ORAL | 3 refills | Status: AC
Start: 2023-04-23 — End: ?

## 2023-04-23 NOTE — Progress Notes (Signed)
    I, Maysun Anabel Bene, acting as a scribe for Riki Altes, MD., have documented all relevant documentation on the behalf of Riki Altes, MD, as directed by Riki Altes, MD while in the presence of Riki Altes, MD.  04/23/2023 3:29 PM   Christopher West Jun 11, 1980 [id number]  Referring provider: Judy Pimple, MD [address] [place],  [place] [address]  Chief Complaint  Patient presents with   Establish Care    HPI: Christopher West is a 42 y.o. male presents for evaluation of scrotal pain.   Status post vasectomy by Dr. Evelene Croon in 2017. Approximately 6 months ago, he began to experience intermittent scrotal pain with ejaculation, more bothersome on the left than right.  No identifiable associated factors. Has no bothersome lower urinary tract symptoms.  No scrotal mass or swelling.   Home Medications:  Allergies as of 04/23/2023       Reactions   Ativan [lorazepam] Anxiety        Medication List        Accurate as of April 23, 2023  3:29 PM. If you have any questions, ask your nurse or doctor.          alfuzosin 10 MG 24 hr tablet Commonly known as: UROXATRAL Take 1 tablet (10 mg total) by mouth daily with breakfast. Started by: Riki Altes   Vyvanse 40 MG capsule Generic drug: lisdexamfetamine Take 40 mg by mouth daily.   lisdexamfetamine 40 MG capsule Commonly known as: VYVANSE Take 1 capsule (40 mg total) by mouth daily.        Allergies:  Allergies  Allergen Reactions   Ativan [Lorazepam] Anxiety    Family History: Family History  Problem Relation Age of Onset   Heart attack Father    Heart disease Maternal Grandfather    Heart failure Maternal Grandfather     Social History:  reports that he has never smoked. He has never used smokeless tobacco. He reports that he does not currently use alcohol. He reports current drug use. Drug: Marijuana.   Physical Exam: BP 119/75   Pulse 76   Ht 5\' 8"  (1.727  m)   Wt 210 lb (95.3 kg)   BMI 31.93 kg/m   Constitutional:  Alert and oriented, No acute distress. HEENT: [place] AT Respiratory: Normal respiratory effort, no increased work of breathing. GU: Phallus without lesions, testes descended bilaterally without masses or tenderness. Spermatic cord/epididymis palpably normal bilaterally. Nno evidence of hernia. Psychiatric: Normal mood and affect.   Assessment & Plan:    1. Scrotal pain No abnormalities on exam.  His pain is associated with ejaculation and we discussed possibility of prostatic inflammation with referred pain.  If symptoms are bothersome, I offered him a alpha blocker trial to see if this lessened his discomfort, and he was interested in pursuing. RX alfuzosin 10 mg in pharmacy.  He will return as needed for any increasing pain or change in symptoms.   I have reviewed the above documentation for accuracy and completeness, and I agree with the above.   Riki Altes, MD  [facility] [address], [address] [place], [place] [address] [phone]

## 2023-05-01 ENCOUNTER — Ambulatory Visit: Payer: 59 | Attending: Nurse Practitioner | Admitting: Nurse Practitioner

## 2023-05-01 ENCOUNTER — Encounter: Payer: Self-pay | Admitting: Nurse Practitioner

## 2023-05-01 NOTE — Progress Notes (Deleted)
Office Visit    Patient Name: Christopher West Date of Encounter: 05/01/2023  Primary Care Provider:  Judy Pimple, MD Primary Cardiologist:  Debbe Odea, MD  Chief Complaint    42 y.o. male with a history of attention deficit disorder, family history of sudden cardiac death, and palpitations, presents for follow-up after recent cardiac testing.  Past Medical History  Subjective   Past Medical History:  Diagnosis Date   ADD (attention deficit disorder)    Family history of sudden cardiac death    a. 01-May-2023 Echo: EF 60-65%, no rwma, nl RV size/fxn. Mild MR.   Palpitations    a. 02/2023 Echo: Predominantly sinus rhythm @ 74 (45-142). 1 run SVT (9 beats @ 125). Rare PACs/PVCs.  Triggered events associated with sinus rhythm.  No significant arrhythmias or pauses.   No past surgical history on file.  Allergies  Allergies  Allergen Reactions   Ativan [Lorazepam] Anxiety      History of Present Illness      42 y.o. y/o male with a history of attention deficit disorder, family history of sudden cardiac death, and palpitations.  [name] in September 2024 in the setting of a 88-month history of intermittent palpitations.  This was followed by echocardiogram which showed normal LV function with mild MR.  Event monitoring showed predominantly sinus rhythm at an average rate of 74 bpm with 1, 9 beat run of SVT and otherwise no significant arrhythmias or pauses.  Triggered events were associated with sinus rhythm.     Objective  Home Medications    Current Outpatient Medications  Medication Sig Dispense Refill   alfuzosin (UROXATRAL) 10 MG 24 hr tablet Take 1 tablet (10 mg total) by mouth daily with breakfast. 30 tablet 3   lisdexamfetamine (VYVANSE) 40 MG capsule Take 1 capsule (40 mg total) by mouth daily. 30 capsule 0   VYVANSE 40 MG capsule Take 40 mg by mouth daily.     No current facility-administered medications for this visit.      Physical Exam    VS:  There were no vitals taken for this visit. , BMI There is no height or weight on file to calculate BMI.       GEN: Well nourished, well developed, in no acute distress. HEENT: normal. Neck: Supple, no JVD, carotid bruits, or masses. Cardiac: RRR, no murmurs, rubs, or gallops. No clubbing, cyanosis, edema.  Radials 2+/PT 2+ and equal bilaterally.  Respiratory:  Respirations regular and unlabored, clear to auscultation bilaterally. GI: Soft, nontender, nondistended, BS + x 4. MS: no deformity or atrophy. Skin: warm and dry, no rash. Neuro:  Strength and sensation are intact. Psych: Normal affect.  Accessory Clinical Findings    ECG personally reviewed by me today -    *** - no acute changes.  Lab Results  Component Value Date   WBC 4.9 08/04/2017   HGB 13.6 08/04/2017   HCT 42.7 08/04/2017   MCV 90 08/04/2017   PLT 379 08/04/2017   Lab Results  Component Value Date   CREATININE 0.99 08/04/2017   BUN 11 08/04/2017   NA 146 (H) 08/04/2017   K 4.2 08/04/2017   CL 106 08/04/2017   CO2 21 08/04/2017   Lab Results  Component Value Date   ALT 18 08/04/2017   AST 13 08/04/2017   ALKPHOS 44 08/04/2017   BILITOT 0.3 08/04/2017   Lab Results  Component Value Date   CHOL 147 08/04/2017  HDL 57 08/04/2017   LDLCALC 78 08/04/2017   TRIG 58 08/04/2017   CHOLHDL 2.6 08/04/2017    No results found for: "HGBA1C" Lab Results  Component Value Date   TSH CANCELED 08/04/2017       Assessment & Plan    1.  ***  Christopher Ducking, NP 05/01/2023, 11:02 AM

## 2023-05-02 ENCOUNTER — Encounter: Payer: Self-pay | Admitting: Nurse Practitioner

## 2023-06-16 ENCOUNTER — Ambulatory Visit
Admission: RE | Admit: 2023-06-16 | Discharge: 2023-06-16 | Disposition: A | Discharge status: Home / Self Care | Payer: BC Managed Care – PPO | Source: Ambulatory Visit | Attending: Emergency Medicine | Admitting: Emergency Medicine

## 2023-06-16 VITALS — BP 122/81 | HR 70 | Temp 98.4°F | Resp 18

## 2023-06-16 DIAGNOSIS — B349 Viral infection, unspecified: Secondary | ICD-10-CM | POA: Diagnosis not present

## 2023-06-16 LAB — POC COVID19/FLU A&B COMBO
Covid Antigen, POC: NEGATIVE
Influenza A Antigen, POC: NEGATIVE
Influenza B Antigen, POC: NEGATIVE

## 2023-06-16 NOTE — ED Triage Notes (Signed)
 Patient to Urgent Care with complaints of weakness/ intermittent headaches/ intermittent fevers/ poor appetite/ fatigue. Unsure of temps.   Symptoms started Saturday. Feels as though his symptoms are improving.  Meds: no otc.

## 2023-06-16 NOTE — Discharge Instructions (Addendum)
 The COVID and flu tests are negative.   Take Tylenol as needed for fever or discomfort.  Take plain Mucinex as needed for congestion.  Rest and keep yourself hydrated.    Follow-up with your primary care provider if your symptoms are not improving.

## 2023-06-16 NOTE — ED Provider Notes (Signed)
 [facility]AL[facility]    CSN: [id number] Arrival date & time: 06/16/23  1648      History   Chief Complaint Chief Complaint  Patient presents with   Fever    Flu/Covid like symptoms - Entered by patient    HPI SOCORRO EBRON is a 43 y.o. male.  Patient presents with 2-day history of fever, fatigue, headache, decreased appetite.  He had diarrhea at the onset of his illness but none today.  No cough, shortness of breath, vomiting, abdominal pain.  No OTC medications taken.  The history is provided by the patient and medical records.    Past Medical History:  Diagnosis Date   ADD (attention deficit disorder)    Family history of sudden cardiac death    a. 03/21/23 Echo: EF 60-65%, no rwma, nl RV size/fxn. Mild MR.   Palpitations    a. 02/2023 Echo: Predominantly sinus rhythm @ 74 (45-142). 1 run SVT (9 beats @ 125). Rare PACs/PVCs.  Triggered events associated with sinus rhythm.  No significant arrhythmias or pauses.    Patient Active Problem List   Diagnosis Date Noted   Palpitations December 15, 2022   Family history of sudden cardiac death 12/15/22   Low back pain 10/10/2021   Current non-smoker 04/11/2021   ADD (attention deficit disorder) 04/11/2021   Stress reaction 08/04/2017   Skin lesion of back 08/04/2017   Routine general medical examination at a health care facility 12/07/2014   Lipoma 01/18/2010   VENEREAL WART 05/27/2007   ACNE VULGARIS 05/26/2007   Allergy 05/26/2007    History reviewed. No pertinent surgical history.     Home Medications    Prior to Admission medications   Medication Sig Start Date End Date Taking? Authorizing Provider  alfuzosin  (UROXATRAL ) 10 MG 24 hr tablet Take 1 tablet (10 mg total) by mouth daily with breakfast. Patient not taking: Reported on 06/16/2023 04/23/23   Twylla Glendia BROCKS, MD  lisdexamfetamine (VYVANSE ) 40 MG capsule Take 1 capsule (40 mg total) by mouth daily. 09/27/22     VYVANSE  40 MG capsule Take 40 mg by mouth  daily. 10/08/19   [provider]    Family History Family History  Problem Relation Age of Onset   Heart attack Father    Heart disease Maternal Grandfather    Heart failure Maternal Grandfather     Social History Social History   Tobacco Use   Smoking status: Never   Smokeless tobacco: Never  Vaping Use   Vaping status: Never Used  Substance Use Topics   Alcohol use: Not Currently    Comment: Rare   Drug use: Yes    Types: Marijuana    Comment: occ     Allergies   Ativan  [lorazepam ]   Review of Systems Review of Systems  Constitutional:  Positive for appetite change, fatigue and fever.  HENT:  Negative for congestion, ear pain and sore throat.   Respiratory:  Negative for cough and shortness of breath.   Cardiovascular:  Negative for chest pain and palpitations.  Gastrointestinal:  Positive for diarrhea. Negative for abdominal pain and vomiting.  Neurological:  Positive for headaches.     Physical Exam Triage Vital Signs ED Triage Vitals  Encounter Vitals Group     BP      Systolic BP Percentile      Diastolic BP Percentile      Pulse      Resp      Temp      Temp  src      SpO2      Weight      Height      Head Circumference      Peak Flow      Pain Score      Pain Loc      Pain Education      Exclude from Growth Chart    No data found.  Updated Vital Signs BP 122/81   Pulse 70   Temp 98.4 F (36.9 C)   Resp 18   SpO2 97%   Visual Acuity Right Eye Distance:   Left Eye Distance:   Bilateral Distance:    Right Eye Near:   Left Eye Near:    Bilateral Near:     Physical Exam Constitutional:      General: He is not in acute distress. HENT:     Right Ear: Tympanic membrane normal.     Left Ear: Tympanic membrane normal.     Nose: Nose normal.     Mouth/Throat:     Mouth: Mucous membranes are moist.     Pharynx: Oropharynx is clear.  Cardiovascular:     Rate and Rhythm: Normal rate and regular rhythm.     Heart sounds:  Normal heart sounds.  Pulmonary:     Effort: Pulmonary effort is normal. No respiratory distress.     Breath sounds: Normal breath sounds.  Abdominal:     General: Bowel sounds are normal.     Palpations: Abdomen is soft.     Tenderness: There is no abdominal tenderness. There is no guarding or rebound.  Neurological:     Mental Status: He is alert.      UC Treatments / Results  Labs (all labs ordered are listed, but only abnormal results are displayed) Labs Reviewed  POC COVID19/FLU A&B COMBO    EKG   Radiology No results found.  Procedures Procedures (including critical care time)  Medications Ordered in UC Medications - No data to display  Initial Impression / Assessment and Plan / UC Course  I have reviewed the triage vital signs and the nursing notes.  Pertinent labs & imaging results that were available during my care of the patient were reviewed by me and considered in my medical decision making (see chart for details).    Viral illness.  Rapid COVID and flu negative.  Discussed symptomatic treatment including Tylenol  as needed for fever or discomfort, plain Mucinex as needed for congestion, rest, hydration.  Instructed patient to follow-up with PCP if not improving.  ED precautions given.  Patient agrees to plan of care.   Final Clinical Impressions(s) / UC Diagnoses   Final diagnoses:  Viral illness     Discharge Instructions      The COVID and flu tests are negative.   Take Tylenol  as needed for fever or discomfort.  Take plain Mucinex as needed for congestion.  Rest and keep yourself hydrated.    Follow-up with your primary care provider if your symptoms are not improving.         ED Prescriptions   None    PDMP not reviewed this encounter.   Corlis Burnard DEL, NP 06/16/23 [phone]

## 2023-10-30 ENCOUNTER — Telehealth: Payer: Self-pay

## 2023-10-30 NOTE — Telephone Encounter (Signed)
 Aware, will watch for correspondence Agree he needs to stop BC powders or any nsaids

## 2023-10-30 NOTE — Transitions of Care (Post Inpatient/ED Visit) (Signed)
 I spoke with pt; pt only has a moment to speak;I offered to cb when more convenient to talk but pt said lets do this now;  pt seen [facility] on 10/27/23 for sharp upper abd pain and vomiting dark coffee ground emesis. pt has not gotten omeprazole due to ins issue; pt at pharmacy now to resolve issue. pt already had endoscopy and pt has 2 ulcers. For now pt is going to FU with specialist but pt said he may call for appt with [facility] due to thinking that ulcers were caused by pt taking BC powders for H/As. UC & ED precautions given and pt voiced understanding.Sending note to [facility].      10/30/2023  Name: [facility] MRN: [id number] DOB: April 24, 1981  Today's TOC FU Call Status: Today's TOC FU Call Status:: Successful TOC FU Call Completed TOC FU Call Complete Date: 10/30/23 Patient's Name and Date of Birth confirmed.  Transition Care Management Follow-up Telephone Call Date of Discharge: 10/26/23 Discharge Facility: Other ([facility]) Name of Other ([facility]) [facility] Facility: [facility] Type of Discharge: Emergency Department Reason for ED Visit: Other: (I spoke with pt; pt seen [facility] on 10/27/23 for sharp upper abd pain and vomiting dark coffee ground  emesis. pt has not gotten omeprazole due to ins issue; pt at pharmacy now to resolve issue. pt already had endoscopy and pt has 2 ulcers.) How have you been since you were [facility]?: Same Any questions or concerns?: No  Items Reviewed: Did you receive and understand the discharge instructions provided?: Yes Medications obtained,verified, and reconciled?: Partial Review Completed Reason for Partial Mediation Review: pt was at pharmacy and not alot of time to talk. pt has not gotten omeprazole due to ins issues.pt is talking with pharmacy at this time; Any new allergies since your discharge?: No Dietary orders reviewed?: NA Do you have support at home?: Yes People in Home  [RPT]: spouse Name of Support/Comfort Primary Source: [facility]  Medications Reviewed Today: Medications Reviewed Today   Medications were not reviewed in this encounter     Home Care and Equipment/Supplies: Were Home Health Services Ordered?: NA Any new equipment or medical supplies ordered?: NA  Functional Questionnaire: Do you need assistance with bathing/showering or dressing?: No Do you need assistance with meal preparation?: No Do you need assistance with eating?: No Do you have difficulty maintaining continence: No Do you need assistance with getting out of bed/getting out of a chair/moving?: No  Follow up appointments reviewed: PCP Follow-up appointment confirmed?: NA [facility] Follow-up appointment confirmed?: Yes Date of Specialist follow-up appointment?: 10/28/23 Follow-Up Specialty Provider:: [facility] Do you need transportation to your follow-up appointment?: No Do you understand care options if your condition(s) worsen?: Yes-patient verbalized understanding    SIGNATURE Christopher Crocker, LPN

## 2024-02-10 ENCOUNTER — Ambulatory Visit: Admitting: Family Medicine

## 2024-02-10 ENCOUNTER — Encounter: Payer: Self-pay | Admitting: Family Medicine

## 2024-02-10 VITALS — BP 126/64 | HR 62 | Temp 98.4°F | Ht 68.0 in | Wt 206.0 lb

## 2024-02-10 DIAGNOSIS — E669 Obesity, unspecified: Secondary | ICD-10-CM | POA: Insufficient documentation

## 2024-02-10 DIAGNOSIS — Z13220 Encounter for screening for lipoid disorders: Secondary | ICD-10-CM | POA: Diagnosis not present

## 2024-02-10 NOTE — Assessment & Plan Note (Addendum)
 Pt needs to loose 20 lb before [employer]  Discussed how this problem influences overall health and the risks it imposes  Reviewed plan for weight loss with lower calorie diet (via better food choices (lower glycemic and portion control) along with exercise building up to or more than 30 minutes 5 days per week including some aerobic activity and strength training   Encouraged to count calories to get a sense of how much he is eating Pt does not feel his appetite is out of control   Encouraged to lean towards more strength training with exercise and increase to 5 d per week  Also regulate sleep habits  Labs today  Referral to healthy weight center   I personally spent a total of 27 minutes in the care of the patient today including preparing to see the patient, getting/reviewing separately obtained history, performing a medically appropriate exam/evaluation, counseling and educating, placing orders, referring and communicating with other health care professionals, and documenting clinical information in the EHR.

## 2024-02-10 NOTE — Patient Instructions (Addendum)
  Try to get most of your carbohydrates from produce (with the exception of white potatoes) and whole grains Eat less bread/pasta/rice/snack foods/cereals/sweets and other items from the middle of the grocery store (processed carbs)  Stick to lean proteins   Aim for exercise 5 days per week even if it is not as long a time  Focus on more muscle building than cardio   Think about counting calories to see why you are not losing weight   Labs today   I put the referral in for the [facility]  Please let us  know if you don't hear in 1-2 weeks to set that up (mychart message or call or letter)

## 2024-02-10 NOTE — Assessment & Plan Note (Signed)
 Lab today.

## 2024-02-10 NOTE — Progress Notes (Signed)
 Subjective:    Patient ID: [facility], male    DOB: Oct 25, 1980, 43 y.o.   MRN: [id number]  HPI  Wt Readings from Last 3 Encounters:  02/10/24 206 lb (93.4 kg)  04/23/23 210 lb (95.3 kg)  02/26/23 211 lb (95.7 kg)   31.32 kg/m  Vitals:   02/10/24 1406  BP: 126/64  Pulse: 62  Temp: 98.4 F (36.9 C)  SpO2: 96%    Pt presents to discuss weight   He is planning to go into the [employer]  Needs to loose 20 lb   Cannot get rid of excess weight   Is eating twice daily  Breakfast 9-10 am  Either lunch or dinner     Home made ramen- eats the meat/veggies/eggs Egg for breakfast  Some bacon   Mainly vegetables and meat  Salmon  Shrimp  Some peanut butter   Portions vary  If small portions - then perhaps too many small portion    Does get very hungry after vyvanse  wears off (takes it at lunch)    Avoids pasta/ bread/ rice    No added sugar except in coffee   Thinks age is reason for not losing weight    Exercise   Works out at least 3 d per week   (about 1.5 hours)  Some jogging (2 mi)    Strength training with machines legs  Bench presses/curls for arms   Lab Results  Component Value Date   TSH CANCELED 08/04/2017   Lab Results  Component Value Date   WBC 4.9 08/04/2017   HGB 13.6 08/04/2017   HCT 42.7 08/04/2017   MCV 90 08/04/2017   PLT 379 08/04/2017      12/19/22   12:42 PM 11/12/2021    2:05 PM 04/11/2021   12:50 PM 08/04/2017    4:11 PM  Depression screen PHQ 2/9  Decreased Interest 0 0 0 1  Down, Depressed, Hopeless 1 0 0 2  PHQ - 2 Score 1 0 0 3  Altered sleeping 1  0 2  Tired, decreased energy 0  1 0  Change in appetite 0  0 1  Feeling bad or failure about yourself  0  0 3  Trouble concentrating 1  1 2   Moving slowly or fidgety/restless 0  0 0  Suicidal thoughts 0  0 1  PHQ-9 Score 3  2 12   Difficult doing work/chores Not difficult at all      Does not get enough sleep -is night owl and most creative/unproductive  at night This can sometimes affect mood    Patient Active Problem List   Diagnosis Date Noted   Obesity (BMI 30-39.9) 02/10/2024   Lipid screening 02/10/2024   Palpitations Dec 19, 2022   Family history of sudden cardiac death 12/19/22   Low back pain 10/10/2021   Current non-smoker 04/11/2021   ADD (attention deficit disorder) 04/11/2021   Stress reaction 08/04/2017   Skin lesion of back 08/04/2017   Routine general medical examination at a health care facility 12/07/2014   Lipoma 01/18/2010   VENEREAL WART 05/27/2007   ACNE VULGARIS 05/26/2007   Allergy 05/26/2007   Past Medical History:  Diagnosis Date   ADD (attention deficit disorder)    Family history of sudden cardiac death    a. Mar 26, 2023 Echo: EF 60-65%, no rwma, nl RV size/fxn. Mild MR.   Palpitations    a. 02/2023 Echo: Predominantly sinus rhythm @ 74 (45-142). 1 run SVT (9 beats @ 125).  Rare PACs/PVCs.  Triggered events associated with sinus rhythm.  No significant arrhythmias or pauses.   History reviewed. No pertinent surgical history. Social History   Tobacco Use   Smoking status: Never   Smokeless tobacco: Never  Vaping Use   Vaping status: Never Used  Substance Use Topics   Alcohol use: Not Currently    Comment: Rare   Drug use: Yes    Types: Marijuana    Comment: occ   Family History  Problem Relation Age of Onset   Heart attack Father    Heart disease Maternal Grandfather    Heart failure Maternal Grandfather    Allergies  Allergen Reactions   Ativan  [Lorazepam ] Anxiety   Current Outpatient Medications on File Prior to Visit  Medication Sig Dispense Refill   alfuzosin  (UROXATRAL ) 10 MG 24 hr tablet Take 1 tablet (10 mg total) by mouth daily with breakfast. 30 tablet 3   lisdexamfetamine (VYVANSE ) 40 MG capsule Take 1 capsule (40 mg total) by mouth daily. 30 capsule 0   VYVANSE  40 MG capsule Take 40 mg by mouth daily.     No current facility-administered medications on file prior to visit.     Review of Systems  Constitutional:  Positive for appetite change, fatigue and unexpected weight change. Negative for activity change and fever.  HENT:  Negative for congestion, rhinorrhea, sore throat and trouble swallowing.   Eyes:  Negative for pain, redness, itching and visual disturbance.  Respiratory:  Negative for cough, chest tightness, shortness of breath and wheezing.   Cardiovascular:  Negative for chest pain and palpitations.  Gastrointestinal:  Negative for abdominal pain, blood in stool, constipation, diarrhea and nausea.  Endocrine: Negative for cold intolerance, heat intolerance, polydipsia and polyuria.  Genitourinary:  Negative for difficulty urinating, dysuria, frequency and urgency.  Musculoskeletal:  Negative for arthralgias, joint swelling and myalgias.  Skin:  Negative for pallor and rash.  Neurological:  Negative for dizziness, tremors, weakness, numbness and headaches.  Hematological:  Negative for adenopathy. Does not bruise/bleed easily.  Psychiatric/Behavioral:  Positive for sleep disturbance. Negative for decreased concentration and dysphoric mood. The patient is not nervous/anxious.        Objective:   Physical Exam Constitutional:      General: He is not in acute distress.    Appearance: Normal appearance. He is well-developed. He is obese. He is not ill-appearing or diaphoretic.  HENT:     Head: Normocephalic and atraumatic.  Eyes:     Conjunctiva/sclera: Conjunctivae normal.     Pupils: Pupils are equal, round, and reactive to light.  Neck:     Thyroid: No thyromegaly.     Vascular: No carotid bruit or JVD.  Cardiovascular:     Rate and Rhythm: Normal rate and regular rhythm.     Heart sounds: Normal heart sounds.     No gallop.  Pulmonary:     Effort: Pulmonary effort is normal. No respiratory distress.     Breath sounds: Normal breath sounds. No wheezing or rales.  Abdominal:     General: There is no distension or abdominal bruit.      Palpations: Abdomen is soft.  Musculoskeletal:     Cervical back: Normal range of motion and neck supple.     Right lower leg: No edema.     Left lower leg: No edema.  Lymphadenopathy:     Cervical: No cervical adenopathy.  Skin:    General: Skin is warm and dry.     Coloration: Skin  is not pale.     Findings: No rash.  Neurological:     Mental Status: He is alert.     Coordination: Coordination normal.     Deep Tendon Reflexes: Reflexes are normal and symmetric. Reflexes normal.  Psychiatric:        Mood and Affect: Mood normal.           Assessment & Plan:   Problem List Items Addressed This Visit       Other   Obesity (BMI 30-39.9) - Primary   Pt needs to loose 20 lb before joining [employer]  Discussed how this problem influences overall health and the risks it imposes  Reviewed plan for weight loss with lower calorie diet (via better food choices (lower glycemic and portion control) along with exercise building up to or more than 30 minutes 5 days per week including some aerobic activity and strength training   Encouraged to count calories to get a sense of how much he is eating Pt does not feel his appetite is out of control   Encouraged to lean towards more strength training with exercise and increase to 5 d per week  Also regulate sleep habits  Labs today  Referral to healthy weight center   I personally spent a total of 27 minutes in the care of the patient today including preparing to see the patient, getting/reviewing separately obtained history, performing a medically appropriate exam/evaluation, counseling and educating, placing orders, referring and communicating with other health care professionals, and documenting clinical information in the EHR.       Relevant Orders   Amb Ref to Medical Weight Management   Comprehensive metabolic panel with GFR   Hemoglobin A1c   Lipid panel   TSH   Lipid screening   Lab today

## 2024-02-12 ENCOUNTER — Ambulatory Visit: Payer: Self-pay | Admitting: Family Medicine

## 2024-02-12 LAB — COMPREHENSIVE METABOLIC PANEL WITH GFR
ALT: 40 IU/L (ref 0–44)
AST: 30 IU/L (ref 0–40)
Albumin: 4.5 g/dL (ref 4.1–5.1)
Alkaline Phosphatase: 64 IU/L (ref 44–121)
BUN/Creatinine Ratio: 16 (ref 9–20)
BUN: 16 mg/dL (ref 6–24)
Bilirubin Total: 0.4 mg/dL (ref 0.0–1.2)
CO2: 18 mmol/L — ABNORMAL LOW (ref 20–29)
Calcium: 9.5 mg/dL (ref 8.7–10.2)
Chloride: 102 mmol/L (ref 96–106)
Creatinine, Ser: 1.03 mg/dL (ref 0.76–1.27)
Globulin, Total: 2.8 g/dL (ref 1.5–4.5)
Glucose: 92 mg/dL (ref 70–99)
Potassium: 4.6 mmol/L (ref 3.5–5.2)
Sodium: 137 mmol/L (ref 134–144)
Total Protein: 7.3 g/dL (ref 6.0–8.5)
eGFR: 91 mL/min/1.73 (ref >59)

## 2024-02-12 LAB — LIPID PANEL
Chol/HDL Ratio: 4.3 ratio (ref 0.0–5.0)
Cholesterol, Total: 180 mg/dL (ref 100–199)
HDL: 42 mg/dL (ref >39)
LDL Chol Calc (NIH): 119 mg/dL — ABNORMAL HIGH (ref 0–99)
Triglycerides: 106 mg/dL (ref 0–149)
VLDL Cholesterol Cal: 19 mg/dL (ref 5–40)

## 2024-02-12 LAB — HEMOGLOBIN A1C
Est. average glucose Bld gHb Est-mCnc: 94 mg/dL
Hgb A1c MFr Bld: 4.9 % (ref 4.8–5.6)

## 2024-02-12 LAB — TSH: TSH: 1.56 u[IU]/mL (ref 0.450–4.500)

## 2024-02-24 ENCOUNTER — Encounter (INDEPENDENT_AMBULATORY_CARE_PROVIDER_SITE_OTHER): Payer: Self-pay

## 2024-05-28 ENCOUNTER — Telehealth: Payer: Self-pay | Admitting: Family Medicine

## 2024-05-28 NOTE — Telephone Encounter (Signed)
 Copied from CRM #[id number]. Topic: Clinical - Medication Prior Auth >> May 28, 2024  4:07 PM Melissa C wrote: Reason for CRM: Patient was prescribed Vyvanse  by his attention specialist in [place] and [employer] is needing authorization from patient's PCP for it to be prescribed. They are going to be faxing documents over regarding this but in case you have questions the number provided for patients insurance was [phone]. If you have questions for patient, best phone number is [phone]

## 2024-05-31 NOTE — Telephone Encounter (Signed)
 Yes, if it is a proper prior shara- will need to be done by treating provider Thanks

## 2024-05-31 NOTE — Telephone Encounter (Signed)
 Will route to PCP for review, we wouldn't do a PA on med unless PCP take over Rx and sends a new Rx in for Vyvanse  then our PA dpt can process a PA if it's required but 1st part is seeing if PCP is willing to take over med.

## 2024-06-04 NOTE — Telephone Encounter (Signed)
 Called patient let them know authorization will need to be done by prescribing provider. He states he will call back in about hour if any further questions.  No further action needed at this time.

## 2024-06-07 ENCOUNTER — Ambulatory Visit
Admission: EM | Admit: 2024-06-07 | Discharge: 2024-06-07 | Disposition: A | Discharge status: Home / Self Care | Attending: Emergency Medicine | Admitting: Emergency Medicine

## 2024-06-07 ENCOUNTER — Encounter: Payer: Self-pay | Admitting: Emergency Medicine

## 2024-06-07 DIAGNOSIS — J0100 Acute maxillary sinusitis, unspecified: Secondary | ICD-10-CM

## 2024-06-07 MED ORDER — AMOXICILLIN-POT CLAVULANATE 875-125 MG PO TABS: 1.0000 | ORAL_TABLET | Freq: Two times a day (BID) | ORAL | 0 refills | Status: AC

## 2024-06-07 MED ORDER — PREDNISONE 10 MG (21) PO TBPK: ORAL_TABLET | Freq: Every day | ORAL | 0 refills | Status: AC

## 2024-06-07 NOTE — ED Provider Notes (Signed)
 " CAY RALPH PELT    CSN: [id number] Arrival date & time: 06/07/24  1242      History   Chief Complaint Chief Complaint  Patient presents with   Headache   Cough   Fatigue    HPI Christopher West is a 43 y.o. male.   Patient presents for evaluation of a fever peaking at 101, nasal congestion, sinus pain and pressure to the cheeks occurring intermittently, sore throat, productive cough and intermittent headaches beginning 6 days ago.  Experienced burning within the naris as well as the sinuses, able to maintain with use of nasal lavage and nasal spray.  Associated sneezing and has experienced wheezing with coughing but denies presence of shortness of breath.  Additionally has attempted Mucinex and Benadryl .  Tolerable to food and liquids but appetite is decreased.  Denies respiratory history, non-smoker.  No known sick contacts.    Past Medical History:  Diagnosis Date   ADD (attention deficit disorder)    Family history of sudden cardiac death    a. 2023-03-25 Echo: EF 60-65%, no rwma, nl RV size/fxn. Mild MR.   Palpitations    a. 02/2023 Echo: Predominantly sinus rhythm @ 74 (45-142). 1 run SVT (9 beats @ 125). Rare PACs/PVCs.  Triggered events associated with sinus rhythm.  No significant arrhythmias or pauses.    Patient Active Problem List   Diagnosis Date Noted   Obesity (BMI 30-39.9) 02/10/2024   Lipid screening 02/10/2024   Palpitations 12-21-22   Family history of sudden cardiac death 12-21-2022   Low back pain 10/10/2021   Current non-smoker 04/11/2021   ADD (attention deficit disorder) 04/11/2021   Stress reaction 08/04/2017   Skin lesion of back 08/04/2017   Routine general medical examination at a health care facility 12/07/2014   Lipoma 01/18/2010   VENEREAL WART 05/27/2007   ACNE VULGARIS 05/26/2007   Allergy 05/26/2007    History reviewed. No pertinent surgical history.     Home Medications    Prior to Admission medications  Medication  Sig Start Date End Date Taking? Authorizing Provider  alfuzosin  (UROXATRAL ) 10 MG 24 hr tablet Take 1 tablet (10 mg total) by mouth daily with breakfast. 04/23/23   Stoioff, Glendia BROCKS, MD  lisdexamfetamine  (VYVANSE ) 40 MG capsule Take 1 capsule (40 mg total) by mouth daily. 09/27/22     VYVANSE  40 MG capsule Take 40 mg by mouth daily. 10/08/19   [provider]    Family History Family History  Problem Relation Age of Onset   Heart attack Father    Heart disease Maternal Grandfather    Heart failure Maternal Grandfather     Social History Social History[1]   Allergies   Ativan  [lorazepam ]   Review of Systems Review of Systems  Constitutional:  Positive for fever. Negative for activity change, appetite change, chills, diaphoresis, fatigue and unexpected weight change.  HENT:  Positive for congestion, sinus pressure, sinus pain and sore throat. Negative for dental problem, drooling, ear discharge, ear pain, facial swelling, hearing loss, mouth sores, nosebleeds, postnasal drip, rhinorrhea, sneezing, tinnitus, trouble swallowing and voice change.   Respiratory:  Positive for cough. Negative for apnea, choking, chest tightness, shortness of breath, wheezing and stridor.   Neurological:  Positive for headaches. Negative for dizziness, tremors, seizures, syncope, facial asymmetry, speech difficulty, weakness, light-headedness and numbness.     Physical Exam Triage Vital Signs ED Triage Vitals  Encounter Vitals Group     BP 06/07/24 1605 122/72  Girls Systolic BP Percentile --      Girls Diastolic BP Percentile --      Boys Systolic BP Percentile --      Boys Diastolic BP Percentile --      Pulse Rate 06/07/24 1605 63     Resp 06/07/24 1605 18     Temp 06/07/24 1605 98.1 F (36.7 C)     Temp Source 06/07/24 1605 Oral     SpO2 06/07/24 1605 97 %     Weight --      Height --      Head Circumference --      Peak Flow --      Pain Score 06/07/24 1609 4     Pain Loc --       Pain Education --      Exclude from Growth Chart --    No data found.  Updated Vital Signs BP 122/72 (BP Location: Right Arm)   Pulse 63   Temp 98.1 F (36.7 C) (Oral)   Resp 18   SpO2 97%   Visual Acuity Right Eye Distance:   Left Eye Distance:   Bilateral Distance:    Right Eye Near:   Left Eye Near:    Bilateral Near:     Physical Exam Constitutional:      Appearance: Normal appearance.  HENT:     Head: Normocephalic.     Right Ear: Tympanic membrane, ear canal and external ear normal.     Left Ear: Tympanic membrane, ear canal and external ear normal.     Nose: Congestion present.     Mouth/Throat:     Pharynx: No oropharyngeal exudate or posterior oropharyngeal erythema.     Tonsils: 1+ on the right. 1+ on the left.  Cardiovascular:     Rate and Rhythm: Normal rate and regular rhythm.     Pulses: Normal pulses.     Heart sounds: Normal heart sounds.  Pulmonary:     Effort: Pulmonary effort is normal.     Breath sounds: Normal breath sounds.  Neurological:     Mental Status: He is alert.      UC Treatments / Results  Labs (all labs ordered are listed, but only abnormal results are displayed) Labs Reviewed - No data to display  EKG   Radiology No results found.  Procedures Procedures (including critical care time)  Medications Ordered in UC Medications - No data to display  Initial Impression / Assessment and Plan / UC Course  I have reviewed the triage vital signs and the nursing notes.  Pertinent labs & imaging results that were available during my care of the patient were reviewed by me and considered in my medical decision making (see chart for details).  Acute nonrecurrent maxillary sinus  Patient is in no signs of distress nor toxic appearing.  Vital signs are stable.  Low suspicion for pneumonia, pneumothorax or bronchitis and therefore will defer imaging.  Viral testing deferred due to timeline, presentation and symptomology  concerning for sinus infection therefore initiating antibiotics, prescribed Augmentin  and prednisone .May use additional over-the-counter medications as needed for supportive care.  May follow-up with urgent care as needed if symptoms persist or worsen.  Note given.    Final Clinical Impressions(s) / UC Diagnoses   Final diagnoses:  None   Discharge Instructions   None    ED Prescriptions   None    PDMP not reviewed this encounter.     [1]  Social History Tobacco Use  Smoking status: Never   Smokeless tobacco: Never  Vaping Use   Vaping status: Never Used  Substance Use Topics   Alcohol use: Not Currently    Comment: Rare   Drug use: Not Currently    Types: Marijuana    Comment: daril Teresa Shelba JONELLE, NP 06/07/24 1658  "

## 2024-06-07 NOTE — Discharge Instructions (Addendum)
 Today you are evaluated for your upper respiratory symptoms which I believe are consistent with a sinus infection therefore you have been started on antibiotics  Take Augmentin  twice daily for 7 days for treatment of bacteria  Begin prednisone  every morning with food as directed to reduce inflammation and help reduce sinus pressure    You can take Tylenol  and/or Ibuprofen as needed for fever reduction and pain relief.   For cough: honey 1/2 to 1 teaspoon (you can dilute the honey in water or another fluid).  You can also use guaifenesin and dextromethorphan for cough. You can use a humidifier for chest congestion and cough.  If you don't have a humidifier, you can sit in the bathroom with the hot shower running.      For sore throat: try warm salt water gargles, cepacol lozenges, throat spray, warm tea or water with lemon/honey, popsicles or ice, or OTC cold relief medicine for throat discomfort.   For congestion: take a daily anti-histamine like Zyrtec, Claritin, and a oral decongestant, such as pseudoephedrine.  You can also use Flonase  1-2 sprays in each nostril daily.   It is important to stay hydrated: drink plenty of fluids (water, gatorade/powerade/pedialyte, juices, or teas) to keep your throat moisturized and help further relieve irritation/discomfort.

## 2024-06-07 NOTE — ED Triage Notes (Signed)
 Patient reports chest congestion, headache, cough and fatigue  x 6 days. Patient reports sore threat and runny nose on 06-02-24. Patient has been taking mucinex and doing nose rinses. Rates headache 4/10.

## 2024-06-08 ENCOUNTER — Encounter: Payer: Self-pay | Admitting: Family Medicine

## 2024-06-08 ENCOUNTER — Ambulatory Visit: Payer: Self-pay | Admitting: Family Medicine

## 2024-06-08 VITALS — BP 116/68 | HR 79 | Temp 98.3°F | Ht 68.0 in | Wt 198.5 lb

## 2024-06-08 DIAGNOSIS — F900 Attention-deficit hyperactivity disorder, predominantly inattentive type: Secondary | ICD-10-CM

## 2024-06-08 NOTE — Assessment & Plan Note (Signed)
 Dx with ADHD inattentive 3 y ago Sees Dealer at [facility] and does well with generic vyvanse  40 mg daily (does not think he has tried anything else)   Has new ins- ? If will only pay for drug if from pcp Unsure why  May need to change his adhd care to us    Has been out of med for months  Accomodates best he can with organization and exercise  Tolerates med well (does decrease appetite slightly)  No blood pressure issues or palpitations   He will likely get new ins from wife in march   If we have to take over prescription Will need details from ins re: what they need to cover Also records from his attention specialist and test results  Pt will reach out for all of this

## 2024-06-08 NOTE — Patient Instructions (Signed)
 I need  A letter from your insurance explaining coverage issues and why you need to change providers   Original testing results for ADHD  Last note from your specialist   If we end up changing your ADHD care to me I will need a letter from your provider stating that I am taking over your care/this medicine    Find out if your new insurance in march covers the care/medication and if so you may not want to make the switch

## 2024-06-08 NOTE — Progress Notes (Signed)
 "  Subjective:    Patient ID: Christopher West, male    DOB: Oct 26, 1980, 43 y.o.   MRN: [id number]  HPI  Wt Readings from Last 3 Encounters:  06/08/24 198 lb 8 oz (90 kg)  02/10/24 206 lb (93.4 kg)  04/23/23 210 lb (95.3 kg)   30.18 kg/m  Vitals:   06/08/24 1108  BP: 116/68  Pulse: 79  Temp: 98.3 F (36.8 C)  SpO2: 96%    Pt presents for c/o  ADHD   Goes to [facility] for several years  Prescription vyvanse    Had to change his insurance  His new insurance will not cover unless it comes from his primary  Psychiatrist told him this  They cannot prescribe something that a 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) Thinks he had it as a kid   Affects his short term memory  Cannot concentrate Not good with time / constantly runs late   Has associates degree  Is a environmental manager   In school-had lots of missed assignments  Not a good test taker - had to have accomodations   Grades got better as he got older =learned to deal with it   No anxiety or depression  In general is a happy person   Does not always focus on the wrong things  Scatter brained  Has to put more effort in than other people doing the same job   Coping mechanisms  Exercises regularly  Makes a lot of notes  Takes a lot of screen shots   Has had testing at [facility]   Vyvanse  was the first thing he started  20 mg to start  Then went up to 40  Last on generic   No side effects  Does decrease hunger a bit   Never took ritalin or adderall  May have been on one other thing-cannot remember    Mom has ADD also  She is not medicated     Seen at [facility] for acute sinusitis  Prescription augmentin  and prednisone    Expects to get new insurance in march from his wife (travel nurse)       Patient Active Problem List    Diagnosis Date Noted   Obesity (BMI 30-39.9) 02/10/2024   Lipid screening 02/10/2024   Palpitations 01/07/23   Family history of sudden cardiac death 01-07-2023   Low back pain 10/10/2021   Current non-smoker 04/11/2021   ADD (attention deficit disorder) 04/11/2021   Stress reaction 08/04/2017   Skin lesion of back 08/04/2017   Routine general medical examination at a health care facility 12/07/2014   Lipoma 01/18/2010   VENEREAL WART 05/27/2007   ACNE VULGARIS 05/26/2007   Allergy 05/26/2007   Past Medical History:  Diagnosis Date   ADD (attention deficit disorder)    Family history of sudden cardiac death    a. 2023/04/11 Echo: EF 60-65%, no rwma, nl RV size/fxn. Mild MR.   Palpitations    a. 02/2023 Echo: Predominantly sinus rhythm @ 74 (45-142). 1 run SVT (9 beats @ 125). Rare PACs/PVCs.  Triggered events associated with sinus rhythm.  No significant arrhythmias or pauses.   History reviewed. No pertinent surgical history. Social History[1] Family History  Problem Relation Age of Onset   Heart attack Father    Heart disease Maternal Grandfather  Heart failure Maternal Grandfather    Allergies[2] Medications Ordered Prior to Encounter[3]  Review of Systems  Constitutional:  Negative for activity change, appetite change, fatigue, fever and unexpected weight change.  HENT:  Negative for congestion, rhinorrhea, sore throat and trouble swallowing.   Eyes:  Negative for pain, redness, itching and visual disturbance.  Respiratory:  Negative for cough, chest tightness, shortness of breath and wheezing.   Cardiovascular:  Negative for chest pain and palpitations.  Gastrointestinal:  Negative for abdominal pain, blood in stool, constipation, diarrhea and nausea.  Endocrine: Negative for cold intolerance, heat intolerance, polydipsia and polyuria.  Genitourinary:  Negative for difficulty urinating, dysuria, frequency and urgency.  Musculoskeletal:  Negative for arthralgias,  joint swelling and myalgias.  Skin:  Negative for pallor and rash.  Neurological:  Negative for dizziness, tremors, weakness, numbness and headaches.  Hematological:  Negative for adenopathy. Does not bruise/bleed easily.  Psychiatric/Behavioral:  Positive for decreased concentration. Negative for behavioral problems, confusion, dysphoric mood and sleep disturbance. The patient is not nervous/anxious and is not hyperactive.        Objective:   Physical Exam Constitutional:      General: He is not in acute distress.    Appearance: Normal appearance. He is obese. He is not ill-appearing or diaphoretic.  Eyes:     Conjunctiva/sclera: Conjunctivae normal.     Pupils: Pupils are equal, round, and reactive to light.  Cardiovascular:     Rate and Rhythm: Normal rate and regular rhythm.  Pulmonary:     Effort: Pulmonary effort is normal. No respiratory distress.  Neurological:     Mental Status: He is alert.     Motor: No weakness.     Coordination: Coordination normal.     Comments: No tremor   Psychiatric:        Attention and Perception: Attention normal.        Mood and Affect: Mood normal.        Speech: Speech normal.        Behavior: Behavior normal.        Cognition and Memory: Cognition and memory normal.     Comments: Good mood Pleasant  Talkative            Assessment & Plan:   Problem List Items Addressed This Visit       Other   ADD (attention deficit disorder) - Primary   Dx with ADHD inattentive 3 y ago Sees Dealer at [facility] and does well with generic vyvanse  40 mg daily (does not think he has tried anything else)   Has new ins- ? If will only pay for drug if from pcp Unsure why  May need to change his adhd care to us    Has been out of med for months  Accomodates best he can with organization and exercise  Tolerates med well (does decrease appetite slightly)  No blood pressure issues or palpitations   He will likely get  new ins from wife in march   If we have to take over prescription Will need details from ins re: what they need to cover Also records from his attention specialist and test results  Pt will reach out for all of this         [1]  Social History Tobacco Use   Smoking status: Never   Smokeless tobacco: Never  Vaping Use   Vaping status: Never Used  Substance Use Topics   Alcohol use: Not Currently  Comment: Rare   Drug use: Not Currently    Types: Marijuana    Comment: occ  [2]  Allergies Allergen Reactions   Ativan  [Lorazepam ] Anxiety  [3]  Current Outpatient Medications on File Prior to Visit  Medication Sig Dispense Refill   alfuzosin  (UROXATRAL ) 10 MG 24 hr tablet Take 1 tablet (10 mg total) by mouth daily with breakfast. 30 tablet 3   amoxicillin -clavulanate (AUGMENTIN ) 875-125 MG tablet Take 1 tablet by mouth every 12 (twelve) hours. 14 tablet 0   lisdexamfetamine  (VYVANSE ) 40 MG capsule Take 1 capsule (40 mg total) by mouth daily. 30 capsule 0   predniSONE  (STERAPRED UNI-PAK 21 TAB) 10 MG (21) TBPK tablet Take by mouth daily. Take 6 tabs by mouth daily  for 1 days, then 5 tabs for 1 days, then 4 tabs for 1 days, then 3 tabs for 1 days, 2 tabs for 1 days, then 1 tab by mouth daily for 1 days 21 tablet 0   VYVANSE  40 MG capsule Take 40 mg by mouth daily.     No current facility-administered medications on file prior to visit.   "

## 2024-06-08 NOTE — Telephone Encounter (Unsigned)
 Copied from CRM [id number]. Topic: Clinical - Medication Question >> Jun 08, 2024  2:49 PM Aisha D wrote: Reason for CRM:  Patient was prescribed Vyvanse  by his attention specialist in [place] and Insurance is needing authorization from patient's PCP for it to be prescribed. They are going to be faxing documents over regarding this but in case you have questions the number provided for patients insurance was [phone]. If you have questions for patient, best phone number is [phone]     UPDATE: [facility] is calling in regards to the Vyvanse . They stated that they are able to provide the vyvanse  but they are not in network with the pt's insurance so the pt would have to pay out of pocket. They stated that the pt needs the mediation sent from Dr.Tower in order for this medication to be covered.

## 2024-06-09 NOTE — Telephone Encounter (Signed)
 Can they please send last note and also testing results for his ADHD?, also result of last drug screen   I need to review- thanks for reaching out   So to clarify He cannot go to them since out of network with his insurance They can not refill because he is due for a visit and he cannot afford that visit Let me know if I am not correct   Pt did mention that he will get new insurance in march (may be able to return)   Thanks

## 2024-06-11 NOTE — Telephone Encounter (Signed)
"  Sent records request   "

## 2024-06-13 ENCOUNTER — Telehealth: Payer: Self-pay | Admitting: Family Medicine

## 2024-06-13 NOTE — Telephone Encounter (Signed)
 I received [facility] records from [facility]   I will try to cover the gap for vyvanse  - to fill until he gets insurance to allow him to return there   What pharmacy does he need vyvanse  sent to ?

## 2024-06-14 MED ORDER — LISDEXAMFETAMINE DIMESYLATE 40 MG PO CAPS: 40.0000 mg | ORAL_CAPSULE | Freq: Every day | ORAL | 0 refills | Status: AC

## 2024-06-15 NOTE — Telephone Encounter (Signed)
Got them, thanks.
